# Patient Record
Sex: Female | Born: 1980 | Race: Black or African American | Hispanic: No | Marital: Single | Smoking: Former smoker
Health system: Southern US, Community
[De-identification: ages and names within clinical notes are randomized; demographics above are authoritative.]

## PROBLEM LIST (undated history)

## (undated) DIAGNOSIS — F329 Major depressive disorder, single episode, unspecified: Secondary | ICD-10-CM

## (undated) DIAGNOSIS — I10 Essential (primary) hypertension: Secondary | ICD-10-CM

## (undated) DIAGNOSIS — F419 Anxiety disorder, unspecified: Secondary | ICD-10-CM

## (undated) DIAGNOSIS — K5090 Crohn's disease, unspecified, without complications: Secondary | ICD-10-CM

## (undated) DIAGNOSIS — G510 Bell's palsy: Secondary | ICD-10-CM

## (undated) DIAGNOSIS — I639 Cerebral infarction, unspecified: Secondary | ICD-10-CM

## (undated) DIAGNOSIS — F32A Depression, unspecified: Secondary | ICD-10-CM

## (undated) HISTORY — PX: OTHER SURGICAL HISTORY: SHX169

## (undated) HISTORY — DX: Anxiety disorder, unspecified: F41.9

## (undated) HISTORY — DX: Depression, unspecified: F32.A

---
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
## 2000-03-04 ENCOUNTER — Emergency Department (HOSPITAL_COMMUNITY): Admission: EM | Admit: 2000-03-04 | Discharge: 2000-03-04 | Payer: Self-pay | Admitting: Emergency Medicine

## 2000-03-05 ENCOUNTER — Inpatient Hospital Stay (HOSPITAL_COMMUNITY): Admission: AD | Admit: 2000-03-05 | Discharge: 2000-03-05 | Payer: Self-pay | Admitting: Obstetrics & Gynecology

## 2000-11-19 ENCOUNTER — Emergency Department (HOSPITAL_COMMUNITY): Admission: EM | Admit: 2000-11-19 | Discharge: 2000-11-20 | Payer: Self-pay | Admitting: Emergency Medicine

## 2001-02-20 ENCOUNTER — Emergency Department (HOSPITAL_COMMUNITY): Admission: AC | Admit: 2001-02-20 | Discharge: 2001-02-20 | Payer: Self-pay

## 2001-02-20 ENCOUNTER — Encounter: Payer: Self-pay | Admitting: Emergency Medicine

## 2001-05-16 ENCOUNTER — Encounter: Payer: Self-pay | Admitting: Emergency Medicine

## 2001-05-16 ENCOUNTER — Emergency Department (HOSPITAL_COMMUNITY): Admission: EM | Admit: 2001-05-16 | Discharge: 2001-05-16 | Payer: Self-pay | Admitting: Emergency Medicine

## 2001-05-27 ENCOUNTER — Emergency Department (HOSPITAL_COMMUNITY): Admission: EM | Admit: 2001-05-27 | Discharge: 2001-05-27 | Payer: Self-pay | Admitting: Emergency Medicine

## 2001-06-22 ENCOUNTER — Encounter: Payer: Self-pay | Admitting: Emergency Medicine

## 2001-06-22 ENCOUNTER — Emergency Department (HOSPITAL_COMMUNITY): Admission: EM | Admit: 2001-06-22 | Discharge: 2001-06-22 | Payer: Self-pay | Admitting: Emergency Medicine

## 2001-07-18 ENCOUNTER — Other Ambulatory Visit: Admission: RE | Admit: 2001-07-18 | Discharge: 2001-07-18 | Payer: Self-pay | Admitting: Obstetrics and Gynecology

## 2001-10-02 ENCOUNTER — Emergency Department (HOSPITAL_COMMUNITY): Admission: EM | Admit: 2001-10-02 | Discharge: 2001-10-02 | Payer: Self-pay | Admitting: *Deleted

## 2001-10-02 ENCOUNTER — Encounter: Payer: Self-pay | Admitting: *Deleted

## 2002-01-10 ENCOUNTER — Encounter: Payer: Self-pay | Admitting: Emergency Medicine

## 2002-01-10 ENCOUNTER — Emergency Department (HOSPITAL_COMMUNITY): Admission: EM | Admit: 2002-01-10 | Discharge: 2002-01-10 | Payer: Self-pay | Admitting: Emergency Medicine

## 2002-05-17 ENCOUNTER — Encounter: Payer: Self-pay | Admitting: Emergency Medicine

## 2002-05-17 ENCOUNTER — Emergency Department (HOSPITAL_COMMUNITY): Admission: EM | Admit: 2002-05-17 | Discharge: 2002-05-18 | Payer: Self-pay | Admitting: Emergency Medicine

## 2002-05-18 ENCOUNTER — Ambulatory Visit (HOSPITAL_COMMUNITY): Admission: RE | Admit: 2002-05-18 | Discharge: 2002-05-18 | Payer: Self-pay | Admitting: *Deleted

## 2002-05-18 ENCOUNTER — Encounter: Payer: Self-pay | Admitting: Emergency Medicine

## 2002-05-18 ENCOUNTER — Emergency Department (HOSPITAL_COMMUNITY): Admission: EM | Admit: 2002-05-18 | Discharge: 2002-05-18 | Payer: Self-pay | Admitting: Emergency Medicine

## 2002-12-31 ENCOUNTER — Emergency Department (HOSPITAL_COMMUNITY): Admission: EM | Admit: 2002-12-31 | Discharge: 2002-12-31 | Payer: Self-pay | Admitting: Emergency Medicine

## 2003-01-09 ENCOUNTER — Encounter: Payer: Self-pay | Admitting: *Deleted

## 2003-01-09 ENCOUNTER — Inpatient Hospital Stay (HOSPITAL_COMMUNITY): Admission: AD | Admit: 2003-01-09 | Discharge: 2003-01-09 | Payer: Self-pay | Admitting: *Deleted

## 2003-01-26 ENCOUNTER — Inpatient Hospital Stay (HOSPITAL_COMMUNITY): Admission: AD | Admit: 2003-01-26 | Discharge: 2003-01-26 | Payer: Self-pay | Admitting: *Deleted

## 2003-01-26 ENCOUNTER — Encounter: Payer: Self-pay | Admitting: *Deleted

## 2003-04-15 ENCOUNTER — Encounter: Payer: Self-pay | Admitting: Obstetrics

## 2003-04-15 ENCOUNTER — Encounter: Payer: Self-pay | Admitting: Neurology

## 2003-04-15 ENCOUNTER — Inpatient Hospital Stay (HOSPITAL_COMMUNITY): Admission: EM | Admit: 2003-04-15 | Discharge: 2003-04-15 | Payer: Self-pay | Admitting: *Deleted

## 2003-05-18 ENCOUNTER — Inpatient Hospital Stay (HOSPITAL_COMMUNITY): Admission: AD | Admit: 2003-05-18 | Discharge: 2003-05-18 | Payer: Self-pay | Admitting: Obstetrics

## 2003-06-28 ENCOUNTER — Inpatient Hospital Stay (HOSPITAL_COMMUNITY): Admission: AD | Admit: 2003-06-28 | Discharge: 2003-06-28 | Payer: Self-pay | Admitting: Obstetrics

## 2003-06-28 ENCOUNTER — Encounter: Payer: Self-pay | Admitting: Obstetrics

## 2003-08-15 ENCOUNTER — Inpatient Hospital Stay (HOSPITAL_COMMUNITY): Admission: AD | Admit: 2003-08-15 | Discharge: 2003-08-16 | Payer: Self-pay | Admitting: Obstetrics

## 2003-08-16 ENCOUNTER — Inpatient Hospital Stay (HOSPITAL_COMMUNITY): Admission: AD | Admit: 2003-08-16 | Discharge: 2003-08-17 | Payer: Self-pay | Admitting: Obstetrics

## 2003-08-22 ENCOUNTER — Inpatient Hospital Stay (HOSPITAL_COMMUNITY): Admission: AD | Admit: 2003-08-22 | Discharge: 2003-08-25 | Payer: Self-pay | Admitting: Obstetrics

## 2004-08-22 ENCOUNTER — Emergency Department (HOSPITAL_COMMUNITY): Admission: EM | Admit: 2004-08-22 | Discharge: 2004-08-22 | Payer: Self-pay | Admitting: Emergency Medicine

## 2004-09-19 ENCOUNTER — Ambulatory Visit: Payer: Self-pay | Admitting: Gastroenterology

## 2004-09-22 ENCOUNTER — Ambulatory Visit: Payer: Self-pay | Admitting: Gastroenterology

## 2004-10-03 ENCOUNTER — Ambulatory Visit: Payer: Self-pay | Admitting: Gastroenterology

## 2004-10-27 ENCOUNTER — Ambulatory Visit: Payer: Self-pay | Admitting: Gastroenterology

## 2004-11-13 ENCOUNTER — Emergency Department (HOSPITAL_COMMUNITY): Admission: EM | Admit: 2004-11-13 | Discharge: 2004-11-13 | Payer: Self-pay | Admitting: Family Medicine

## 2005-06-24 ENCOUNTER — Emergency Department (HOSPITAL_COMMUNITY): Admission: EM | Admit: 2005-06-24 | Discharge: 2005-06-24 | Payer: Self-pay | Admitting: Family Medicine

## 2005-06-25 ENCOUNTER — Emergency Department (HOSPITAL_COMMUNITY): Admission: EM | Admit: 2005-06-25 | Discharge: 2005-06-25 | Payer: Self-pay | Admitting: Family Medicine

## 2005-08-01 ENCOUNTER — Inpatient Hospital Stay (HOSPITAL_COMMUNITY): Admission: AD | Admit: 2005-08-01 | Discharge: 2005-08-01 | Payer: Self-pay | Admitting: Obstetrics

## 2005-08-03 ENCOUNTER — Inpatient Hospital Stay (HOSPITAL_COMMUNITY): Admission: AD | Admit: 2005-08-03 | Discharge: 2005-08-03 | Payer: Self-pay | Admitting: Obstetrics

## 2005-11-12 ENCOUNTER — Inpatient Hospital Stay (HOSPITAL_COMMUNITY): Admission: AD | Admit: 2005-11-12 | Discharge: 2005-11-12 | Payer: Self-pay | Admitting: Obstetrics

## 2006-01-04 ENCOUNTER — Inpatient Hospital Stay (HOSPITAL_COMMUNITY): Admission: AD | Admit: 2006-01-04 | Discharge: 2006-01-04 | Payer: Self-pay | Admitting: Obstetrics

## 2006-03-29 ENCOUNTER — Inpatient Hospital Stay (HOSPITAL_COMMUNITY): Admission: AD | Admit: 2006-03-29 | Discharge: 2006-04-02 | Payer: Self-pay | Admitting: Obstetrics

## 2008-03-24 ENCOUNTER — Encounter: Payer: Self-pay | Admitting: Gastroenterology

## 2008-04-02 ENCOUNTER — Other Ambulatory Visit: Admission: RE | Admit: 2008-04-02 | Discharge: 2008-04-02 | Payer: Self-pay | Admitting: Family Medicine

## 2008-04-16 DIAGNOSIS — R29810 Facial weakness: Secondary | ICD-10-CM

## 2008-04-16 DIAGNOSIS — R519 Headache, unspecified: Secondary | ICD-10-CM | POA: Insufficient documentation

## 2008-04-16 DIAGNOSIS — R141 Gas pain: Secondary | ICD-10-CM

## 2008-04-16 DIAGNOSIS — R143 Flatulence: Secondary | ICD-10-CM

## 2008-04-16 DIAGNOSIS — R142 Eructation: Secondary | ICD-10-CM

## 2008-04-16 DIAGNOSIS — G510 Bell's palsy: Secondary | ICD-10-CM

## 2008-04-16 DIAGNOSIS — R51 Headache: Secondary | ICD-10-CM

## 2008-05-18 ENCOUNTER — Ambulatory Visit: Payer: Self-pay | Admitting: Gastroenterology

## 2008-05-18 DIAGNOSIS — R109 Unspecified abdominal pain: Secondary | ICD-10-CM

## 2008-05-18 DIAGNOSIS — K589 Irritable bowel syndrome without diarrhea: Secondary | ICD-10-CM | POA: Insufficient documentation

## 2008-05-18 DIAGNOSIS — R197 Diarrhea, unspecified: Secondary | ICD-10-CM

## 2008-05-18 DIAGNOSIS — I10 Essential (primary) hypertension: Secondary | ICD-10-CM | POA: Insufficient documentation

## 2008-05-18 DIAGNOSIS — R112 Nausea with vomiting, unspecified: Secondary | ICD-10-CM

## 2008-05-18 LAB — CONVERTED CEMR LAB
ALT: 13 units/L (ref 0–35)
Albumin: 4 g/dL (ref 3.5–5.2)
Basophils Relative: 0.4 % (ref 0.0–1.0)
Calcium: 9.3 mg/dL (ref 8.4–10.5)
Creatinine, Ser: 0.7 mg/dL (ref 0.4–1.2)
GFR calc non Af Amer: 108 mL/min
Hemoglobin: 13.2 g/dL (ref 12.0–15.0)
Lymphocytes Relative: 45.6 % (ref 12.0–46.0)
MCV: 88.5 fL (ref 78.0–100.0)
Monocytes Relative: 8.6 % (ref 3.0–12.0)
Platelets: 257 10*3/uL (ref 150–400)
Potassium: 3.8 meq/L (ref 3.5–5.1)
RBC: 4.4 M/uL (ref 3.87–5.11)
Saturation Ratios: 13.2 % — ABNORMAL LOW (ref 20.0–50.0)
Sed Rate: 22 mm/hr (ref 0–22)
TSH: 0.64 microintl units/mL (ref 0.35–5.50)
Total Bilirubin: 0.6 mg/dL (ref 0.3–1.2)
Total Protein: 7.5 g/dL (ref 6.0–8.3)
Vitamin B-12: 537 pg/mL (ref 211–911)

## 2008-06-21 ENCOUNTER — Ambulatory Visit: Payer: Self-pay | Admitting: Gastroenterology

## 2008-08-24 ENCOUNTER — Inpatient Hospital Stay (HOSPITAL_COMMUNITY): Admission: AD | Admit: 2008-08-24 | Discharge: 2008-08-24 | Payer: Self-pay | Admitting: Obstetrics & Gynecology

## 2008-12-20 ENCOUNTER — Inpatient Hospital Stay (HOSPITAL_COMMUNITY): Admission: AD | Admit: 2008-12-20 | Discharge: 2008-12-20 | Payer: Self-pay | Admitting: Obstetrics & Gynecology

## 2008-12-20 ENCOUNTER — Ambulatory Visit: Payer: Self-pay | Admitting: Physician Assistant

## 2009-03-21 ENCOUNTER — Inpatient Hospital Stay (HOSPITAL_COMMUNITY): Admission: AD | Admit: 2009-03-21 | Discharge: 2009-03-21 | Payer: Self-pay | Admitting: Family Medicine

## 2009-03-24 ENCOUNTER — Inpatient Hospital Stay (HOSPITAL_COMMUNITY): Admission: AD | Admit: 2009-03-24 | Discharge: 2009-03-24 | Payer: Self-pay | Admitting: Obstetrics & Gynecology

## 2009-03-26 ENCOUNTER — Inpatient Hospital Stay (HOSPITAL_COMMUNITY): Admission: AD | Admit: 2009-03-26 | Discharge: 2009-03-26 | Payer: Self-pay | Admitting: Obstetrics & Gynecology

## 2009-03-28 ENCOUNTER — Inpatient Hospital Stay (HOSPITAL_COMMUNITY): Admission: AD | Admit: 2009-03-28 | Discharge: 2009-03-28 | Payer: Self-pay | Admitting: Obstetrics & Gynecology

## 2009-03-31 ENCOUNTER — Inpatient Hospital Stay (HOSPITAL_COMMUNITY): Admission: AD | Admit: 2009-03-31 | Discharge: 2009-03-31 | Payer: Self-pay | Admitting: Obstetrics & Gynecology

## 2009-04-03 ENCOUNTER — Inpatient Hospital Stay (HOSPITAL_COMMUNITY): Admission: AD | Admit: 2009-04-03 | Discharge: 2009-04-03 | Payer: Self-pay | Admitting: Obstetrics and Gynecology

## 2009-04-06 ENCOUNTER — Inpatient Hospital Stay (HOSPITAL_COMMUNITY): Admission: AD | Admit: 2009-04-06 | Discharge: 2009-04-06 | Payer: Self-pay | Admitting: Obstetrics & Gynecology

## 2009-10-21 ENCOUNTER — Inpatient Hospital Stay (HOSPITAL_COMMUNITY): Admission: AD | Admit: 2009-10-21 | Discharge: 2009-10-21 | Payer: Self-pay | Admitting: Obstetrics & Gynecology

## 2009-12-29 ENCOUNTER — Ambulatory Visit (HOSPITAL_COMMUNITY): Admission: RE | Admit: 2009-12-29 | Discharge: 2009-12-29 | Payer: Self-pay | Admitting: Obstetrics

## 2010-01-11 ENCOUNTER — Inpatient Hospital Stay (HOSPITAL_COMMUNITY): Admission: AD | Admit: 2010-01-11 | Discharge: 2010-01-12 | Payer: Self-pay | Admitting: Obstetrics

## 2010-03-15 ENCOUNTER — Inpatient Hospital Stay (HOSPITAL_COMMUNITY): Admission: AD | Admit: 2010-03-15 | Discharge: 2010-03-16 | Payer: Self-pay | Admitting: Obstetrics

## 2010-03-20 ENCOUNTER — Inpatient Hospital Stay (HOSPITAL_COMMUNITY): Admission: AD | Admit: 2010-03-20 | Discharge: 2010-03-20 | Payer: Self-pay | Admitting: Obstetrics

## 2010-03-20 ENCOUNTER — Ambulatory Visit: Payer: Self-pay | Admitting: Obstetrics and Gynecology

## 2010-04-29 ENCOUNTER — Inpatient Hospital Stay (HOSPITAL_COMMUNITY): Admission: AD | Admit: 2010-04-29 | Discharge: 2010-05-03 | Payer: Self-pay | Admitting: Obstetrics

## 2010-07-27 ENCOUNTER — Emergency Department (HOSPITAL_COMMUNITY): Admission: EM | Admit: 2010-07-27 | Discharge: 2010-07-27 | Payer: Self-pay | Admitting: Emergency Medicine

## 2010-10-12 ENCOUNTER — Ambulatory Visit (HOSPITAL_COMMUNITY)
Admission: RE | Admit: 2010-10-12 | Discharge: 2010-10-12 | Payer: Self-pay | Source: Home / Self Care | Admitting: Chiropractic Medicine

## 2010-11-13 ENCOUNTER — Encounter: Admission: RE | Admit: 2010-11-13 | Payer: Self-pay | Source: Home / Self Care | Admitting: Orthopedic Surgery

## 2010-11-15 ENCOUNTER — Encounter
Admission: RE | Admit: 2010-11-15 | Discharge: 2010-12-11 | Payer: Self-pay | Source: Home / Self Care | Attending: Orthopedic Surgery | Admitting: Orthopedic Surgery

## 2011-01-03 ENCOUNTER — Encounter (HOSPITAL_COMMUNITY)
Admission: RE | Admit: 2011-01-03 | Discharge: 2011-01-03 | Disposition: A | Discharge status: Home / Self Care | Payer: No Typology Code available for payment source | Source: Ambulatory Visit | Attending: Orthopedic Surgery | Admitting: Orthopedic Surgery

## 2011-01-03 DIAGNOSIS — Z01812 Encounter for preprocedural laboratory examination: Secondary | ICD-10-CM | POA: Insufficient documentation

## 2011-01-03 DIAGNOSIS — Z01811 Encounter for preprocedural respiratory examination: Secondary | ICD-10-CM | POA: Insufficient documentation

## 2011-01-03 LAB — URINALYSIS, ROUTINE W REFLEX MICROSCOPIC
Bilirubin Urine: NEGATIVE
Ketones, ur: NEGATIVE mg/dL
Nitrite: NEGATIVE
Protein, ur: NEGATIVE mg/dL
Specific Gravity, Urine: 1.015 (ref 1.005–1.030)
pH: 7 (ref 5.0–8.0)

## 2011-01-03 LAB — BASIC METABOLIC PANEL
CO2: 26 mEq/L (ref 19–32)
Calcium: 9.4 mg/dL (ref 8.4–10.5)
Chloride: 109 mEq/L (ref 96–112)
Creatinine, Ser: 0.7 mg/dL (ref 0.4–1.2)
GFR calc Af Amer: >60 mL/min (ref >60)
GFR calc non Af Amer: >60 mL/min (ref >60)
Glucose, Bld: 94 mg/dL (ref 70–99)
Potassium: 4.3 mEq/L (ref 3.5–5.1)

## 2011-01-03 LAB — DIFFERENTIAL
Basophils Relative: 0 % (ref 0–1)
Eosinophils Absolute: 0.1 10*3/uL (ref 0.0–0.7)
Lymphocytes Relative: 38 % (ref 12–46)
Monocytes Relative: 7 % (ref 3–12)
Neutro Abs: 4 10*3/uL (ref 1.7–7.7)

## 2011-01-03 LAB — CBC
Platelets: 288 10*3/uL (ref 150–400)
RDW: 13.7 % (ref 11.5–15.5)

## 2011-01-03 LAB — PROTIME-INR: INR: 0.96 (ref 0.00–1.49)

## 2011-01-03 LAB — SURGICAL PCR SCREEN: Staphylococcus aureus: POSITIVE — AB

## 2011-01-05 ENCOUNTER — Ambulatory Visit (HOSPITAL_COMMUNITY)
Admission: RE | Admit: 2011-01-05 | Discharge: 2011-01-05 | Disposition: A | Discharge status: Home / Self Care | Payer: No Typology Code available for payment source | Source: Ambulatory Visit | Attending: Orthopedic Surgery | Admitting: Orthopedic Surgery

## 2011-01-05 DIAGNOSIS — Z01812 Encounter for preprocedural laboratory examination: Secondary | ICD-10-CM | POA: Insufficient documentation

## 2011-01-05 DIAGNOSIS — F17200 Nicotine dependence, unspecified, uncomplicated: Secondary | ICD-10-CM | POA: Insufficient documentation

## 2011-01-05 DIAGNOSIS — X58XXXA Exposure to other specified factors, initial encounter: Secondary | ICD-10-CM | POA: Insufficient documentation

## 2011-01-05 DIAGNOSIS — S43429A Sprain of unspecified rotator cuff capsule, initial encounter: Secondary | ICD-10-CM | POA: Insufficient documentation

## 2011-01-05 DIAGNOSIS — Y9269 Other specified industrial and construction area as the place of occurrence of the external cause: Secondary | ICD-10-CM | POA: Insufficient documentation

## 2011-01-05 DIAGNOSIS — Y990 Civilian activity done for income or pay: Secondary | ICD-10-CM | POA: Insufficient documentation

## 2011-01-09 NOTE — Op Note (Signed)
NAME:  Angela Joyce, Angela Joyce            ACCOUNT NO.:  [id number]  MEDICAL RECORD NO.:  [id number]           PATIENT TYPE:  O  LOCATION:  SDSC                         FACILITY:  [facility]  PHYSICIAN:  Almedia Balls. Ranell Patrick, M.D. DATE OF BIRTH:  01-Jun-1981  DATE OF PROCEDURE:  01/05/2011 DATE OF DISCHARGE:  01/05/2011                              OPERATIVE REPORT   PREOPERATIVE DIAGNOSIS:  Left shoulder rotator cuff tear.  POSTOPERATIVE DIAGNOSIS:  Left shoulder rotator cuff tear.  PROCEDURE PERFORMED:  Left shoulder arthroscopy with arthroscopic subacromial decompression followed by mini open rotator cuff repair.  SURGEON:  Almedia Balls. Ranell Patrick, MD  ASSISTANT:  Donnie Coffin. Dixon, PA  General anesthesia plus interscalene block anesthesia was used.  ESTIMATED BLOOD LOSS:  Minimal.  FLUID REPLACEMENT:  Crystalloid 1500 mL.  INSTRUMENT COUNTS:  Correct.  There were no complications.  Perioperative antibiotics were given.  INDICATIONS:  The patient is a 30 year old female who suffered a work injury to left shoulder.  The patient has had progressive pain, limited motion, and loss of function following her injury.  She has had failure of conservative management and desires operative treatment for an MRI- documented rotator cuff tear.  Informed consent obtained.  DESCRIPTION OF PROCEDURE:  After an adequate level of anesthesia was achieved, the patient was positioned in the modified beach-chair position.  Left shoulder examined under anesthesia.  Full passive range of motion noted with some slight instability, 1 to 2+ anterior glide with a good endpoint.  I am not able to dislocate her but cannot get her perched.  She has an inferior sulcus 1 to 2+ again with a good endpoint. She did have tightening with progressive abduction and external rotation.  She is stable posteriorly with negative posterior shuck, and with her arm flexed up in front of her I am not able to drive her humerus out the  back.  At this point, we went ahead and sterilely prepped and draped shoulder and arm in the usual manner.  We entered the shoulder using standard endoscopic portals in the anterior, posterolateral portals.  We identified normal superior labrum biceps junction.  She did have a rotator cuff tear which was partial-thickness undersurface tear.  At the supraspinatus, the subscapularis was visualized and noted to be normal.  The entire labrum was intact.  The patient did have some hypermobility in the shoulder joint and positive drive-through sign.  This did not appear significantly pathologic. Subacromially, we placed a scope in the subacromial space and there was extensive bursitis, performing a bursectomy and acromioplasty creating a type 1 acromial shape with release of the CA ligament.  Thorough decompression of the rotator cuff outlet with attention towards the lateral overhang was done.  We did not violate the inferior AC ligament. At this point, cuff was inspected from dorsally.  There was definitely a soft spot present at the anterior aspect of the supraspinatus just beyond the biceps.  At this point, we concluded the arthroscopic portion of the surgery.  We then made a mini open incision starting at the anterolateral border of the acromion extending distally about 3 cm, dissection down through subcutaneous tissues.  Using the needle tip Bovie, the deltoid was identified.  The raphe between the anterior and lateral heads was identified and we divided that sharply using the needle tip Bovie and knife.  We then placed our Arthrex retractor visualizing the rotator cuff tear.  It was clearly palpable and visible. We incised the tendon in line with its fibers using a 15-blade scalpel immediately encountering degenerative tissue.  We removed that sharply using knife and rongeur, freshened up the rotator cuff footprint with a rongeur and then placed a single 5.5 bio corkscrew anchor at  the articular margin bring this double loaded #2 FiberWire up through the anterior and posterior portions of the tendon, and we did a side-to-side medial to that with 0 Vicryl suture.  We then tied our two FiberWire sutures together, basically restoring the medial portion of footprint. We then did another FiberWire suture out lateral to the suture anchor sutures, tying that together and then another 0 Vicryl suture out even lateral to that with figure-of-eight.  We had an anatomic repair, not under tension, took the shoulder through full range of motion.  I did place the scope back in the shoulder to make sure the biceps had not been included in the repair which it did not.  We had a nice anatomic repair of that tendon all the way to the articular margin at this point. We thoroughly irrigated subdeltoid interval, closed the deltoid to itself with 0 Vicryl suture followed by 2-0 Vicryl subcutaneous closure and 4-0 Monocryl for skin.  Steri-Strips applied followed by sterile dressing.  The patient tolerated surgery well.     Almedia Balls. Ranell Patrick, M.D.     SRN/MEDQ  D:  01/05/2011  T:  01/06/2011  Job:  [id number]  Electronically Signed by Malon Kindle  on 01/09/2011 12:55:22 PM

## 2011-01-28 LAB — URINALYSIS, ROUTINE W REFLEX MICROSCOPIC
Glucose, UA: NEGATIVE mg/dL
Specific Gravity, Urine: 1.015 (ref 1.005–1.030)
Urobilinogen, UA: 0.2 mg/dL (ref 0.0–1.0)
pH: 6 (ref 5.0–8.0)

## 2011-01-28 LAB — COMPREHENSIVE METABOLIC PANEL
ALT: 13 U/L (ref 0–35)
BUN: 6 mg/dL (ref 6–23)
CO2: 21 mEq/L (ref 19–32)
CO2: 25 mEq/L (ref 19–32)
Calcium: 7.8 mg/dL — ABNORMAL LOW (ref 8.4–10.5)
Calcium: 9.4 mg/dL (ref 8.4–10.5)
Chloride: 106 mEq/L (ref 96–112)
Creatinine, Ser: 0.53 mg/dL (ref 0.4–1.2)
Creatinine, Ser: 0.92 mg/dL (ref 0.4–1.2)
GFR calc Af Amer: >60 mL/min (ref >60)
GFR calc non Af Amer: >60 mL/min (ref >60)
Glucose, Bld: 94 mg/dL (ref 70–99)
Sodium: 137 mEq/L (ref 135–145)
Total Bilirubin: 0.2 mg/dL — ABNORMAL LOW (ref 0.3–1.2)
Total Protein: 6.3 g/dL (ref 6.0–8.3)

## 2011-01-28 LAB — CBC
HCT: 31.9 % — ABNORMAL LOW (ref 36.0–46.0)
Hemoglobin: 10.1 g/dL — ABNORMAL LOW (ref 12.0–15.0)
Hemoglobin: 11 g/dL — ABNORMAL LOW (ref 12.0–15.0)
Hemoglobin: 11.6 g/dL — ABNORMAL LOW (ref 12.0–15.0)
MCHC: 34.6 g/dL (ref 30.0–36.0)
MCV: 94.2 fL (ref 78.0–100.0)
RBC: 3.1 MIL/uL — ABNORMAL LOW (ref 3.87–5.11)
RBC: 3.62 MIL/uL — ABNORMAL LOW (ref 3.87–5.11)
WBC: 8.9 10*3/uL (ref 4.0–10.5)

## 2011-01-28 LAB — STREP B DNA PROBE: Strep Group B Ag: NEGATIVE

## 2011-01-28 LAB — LACTATE DEHYDROGENASE: LDH: 176 U/L (ref 94–250)

## 2011-01-30 LAB — WET PREP, GENITAL
Clue Cells Wet Prep HPF POC: NONE SEEN
Trich, Wet Prep: NONE SEEN
Yeast Wet Prep HPF POC: NONE SEEN

## 2011-01-30 LAB — URINALYSIS, ROUTINE W REFLEX MICROSCOPIC
Bilirubin Urine: NEGATIVE
Glucose, UA: NEGATIVE mg/dL
Hgb urine dipstick: NEGATIVE
Ketones, ur: NEGATIVE mg/dL
Ketones, ur: NEGATIVE mg/dL
Nitrite: NEGATIVE
Protein, ur: 30 mg/dL — AB
Protein, ur: NEGATIVE mg/dL
Urobilinogen, UA: 0.2 mg/dL (ref 0.0–1.0)
Urobilinogen, UA: 0.2 mg/dL (ref 0.0–1.0)
pH: 6 (ref 5.0–8.0)

## 2011-01-30 LAB — URINE MICROSCOPIC-ADD ON

## 2011-02-02 LAB — CBC
Hemoglobin: 10.6 g/dL — ABNORMAL LOW (ref 12.0–15.0)
MCHC: 34 g/dL (ref 30.0–36.0)
MCV: 90.8 fL (ref 78.0–100.0)
RBC: 3.42 MIL/uL — ABNORMAL LOW (ref 3.87–5.11)
WBC: 9.1 10*3/uL (ref 4.0–10.5)

## 2011-02-02 LAB — COMPREHENSIVE METABOLIC PANEL
ALT: 24 U/L (ref 0–35)
AST: 24 U/L (ref 0–37)
CO2: 20 mEq/L (ref 19–32)
Chloride: 104 mEq/L (ref 96–112)
Creatinine, Ser: 0.5 mg/dL (ref 0.4–1.2)
GFR calc Af Amer: >60 mL/min (ref >60)
GFR calc non Af Amer: >60 mL/min (ref >60)
Glucose, Bld: 100 mg/dL — ABNORMAL HIGH (ref 70–99)
Sodium: 133 mEq/L — ABNORMAL LOW (ref 135–145)
Total Bilirubin: 0.1 mg/dL — ABNORMAL LOW (ref 0.3–1.2)

## 2011-02-02 LAB — URINALYSIS, ROUTINE W REFLEX MICROSCOPIC
Bilirubin Urine: NEGATIVE
Ketones, ur: NEGATIVE mg/dL
Nitrite: NEGATIVE
Protein, ur: NEGATIVE mg/dL
Specific Gravity, Urine: 1.02 (ref 1.005–1.030)
Urobilinogen, UA: 0.2 mg/dL (ref 0.0–1.0)

## 2011-02-02 LAB — URINE MICROSCOPIC-ADD ON

## 2011-02-13 LAB — WET PREP, GENITAL

## 2011-02-13 LAB — CBC
Hemoglobin: 12.1 g/dL (ref 12.0–15.0)
RBC: 4.06 MIL/uL (ref 3.87–5.11)
WBC: 6.1 10*3/uL (ref 4.0–10.5)

## 2011-02-13 LAB — URINALYSIS, ROUTINE W REFLEX MICROSCOPIC
Bilirubin Urine: NEGATIVE
Ketones, ur: NEGATIVE mg/dL
Nitrite: NEGATIVE
Urobilinogen, UA: 0.2 mg/dL (ref 0.0–1.0)

## 2011-02-20 LAB — DIFFERENTIAL
Basophils Relative: 0 % (ref 0–1)
Eosinophils Absolute: 0.1 10*3/uL (ref 0.0–0.7)
Eosinophils Absolute: 0.1 10*3/uL (ref 0.0–0.7)
Eosinophils Relative: 2 % (ref 0–5)
Eosinophils Relative: 2 % (ref 0–5)
Lymphocytes Relative: 47 % — ABNORMAL HIGH (ref 12–46)
Lymphs Abs: 1.8 10*3/uL (ref 0.7–4.0)
Lymphs Abs: 2 10*3/uL (ref 0.7–4.0)
Lymphs Abs: 2.3 10*3/uL (ref 0.7–4.0)
Monocytes Absolute: 0.4 10*3/uL (ref 0.1–1.0)
Monocytes Relative: 11 % (ref 3–12)
Monocytes Relative: 8 % (ref 3–12)
Neutro Abs: 2.1 10*3/uL (ref 1.7–7.7)
Neutro Abs: 2.7 10*3/uL (ref 1.7–7.7)
Neutrophils Relative %: 50 % (ref 43–77)

## 2011-02-20 LAB — URINALYSIS, ROUTINE W REFLEX MICROSCOPIC
Bilirubin Urine: NEGATIVE
Ketones, ur: NEGATIVE mg/dL
Nitrite: NEGATIVE
Protein, ur: NEGATIVE mg/dL
pH: 6 (ref 5.0–8.0)

## 2011-02-20 LAB — WET PREP, GENITAL: Yeast Wet Prep HPF POC: NONE SEEN

## 2011-02-20 LAB — COMPREHENSIVE METABOLIC PANEL
ALT: 12 U/L (ref 0–35)
AST: 16 U/L (ref 0–37)
CO2: 22 mEq/L (ref 19–32)
Calcium: 9.2 mg/dL (ref 8.4–10.5)
Chloride: 108 mEq/L (ref 96–112)
GFR calc Af Amer: >60 mL/min (ref >60)
GFR calc non Af Amer: >60 mL/min (ref >60)
Potassium: 3.7 mEq/L (ref 3.5–5.1)
Sodium: 139 mEq/L (ref 135–145)

## 2011-02-20 LAB — CBC
HCT: 36.7 % (ref 36.0–46.0)
Hemoglobin: 12.5 g/dL (ref 12.0–15.0)
MCHC: 34.2 g/dL (ref 30.0–36.0)
MCV: 88 fL (ref 78.0–100.0)
Platelets: 274 10*3/uL (ref 150–400)
RBC: 4.24 MIL/uL (ref 3.87–5.11)
RBC: 4.16 MIL/uL (ref 3.87–5.11)
RDW: 13.9 % (ref 11.5–15.5)
WBC: 4.5 10*3/uL (ref 4.0–10.5)
WBC: 4.8 10*3/uL (ref 4.0–10.5)
WBC: 5.4 10*3/uL (ref 4.0–10.5)

## 2011-02-20 LAB — GC/CHLAMYDIA PROBE AMP, GENITAL: Chlamydia, DNA Probe: NEGATIVE

## 2011-02-20 LAB — CREATININE, SERUM: GFR calc non Af Amer: >60 mL/min (ref >60)

## 2011-02-20 LAB — HCG, QUANTITATIVE, PREGNANCY
hCG, Beta Chain, Quant, S: 14 m[IU]/mL — ABNORMAL HIGH (ref <5)
hCG, Beta Chain, Quant, S: 158 m[IU]/mL — ABNORMAL HIGH (ref <5)
hCG, Beta Chain, Quant, S: 165 m[IU]/mL — ABNORMAL HIGH (ref <5)
hCG, Beta Chain, Quant, S: 171 m[IU]/mL — ABNORMAL HIGH (ref <5)

## 2011-02-27 LAB — URINALYSIS, ROUTINE W REFLEX MICROSCOPIC
Glucose, UA: NEGATIVE mg/dL
Nitrite: NEGATIVE
Protein, ur: NEGATIVE mg/dL
pH: 5.5 (ref 5.0–8.0)

## 2011-02-27 LAB — POCT PREGNANCY, URINE: Preg Test, Ur: NEGATIVE

## 2011-02-27 LAB — WET PREP, GENITAL: Yeast Wet Prep HPF POC: NONE SEEN

## 2011-03-30 NOTE — Consult Note (Signed)
NAME:  Angela Joyce, Angela Joyce                      ACCOUNT NO.:  [id number]   MEDICAL RECORD NO.:  [id number]                   PATIENT TYPE:  EMS   LOCATION:  MAJO                                 FACILITY:  [facility]   PHYSICIAN:  Gustavus Messing. Orlin Hilding, M.D.          DATE OF BIRTH:  Dec 26, 1980   DATE OF CONSULTATION:  04/15/2003  DATE OF DISCHARGE:                                   CONSULTATION   CHIEF COMPLAINT:  Right-sided facial weakness.   HISTORY OF PRESENT ILLNESS:  Ms. Lonardo is a 30 year old black woman (P2,  G0, A1) [redacted] weeks pregnant presented to [facility] this morning with a  five-day history of right-sided facial weakness, pain, drooling, and  tearing.  Slightly better today but because the symptoms were persistent she  came to the emergency room.  She was supposed to come in yesterday but felt  that she had to go to work, so presented today instead.  She has had no  symptoms of right arm or leg weakness.   REVIEW OF SYSTEMS:  Positive for mild headache on the right.  No  complications with this pregnancy.  She has bitten her cheek because of the  facial weakness.   PAST MEDICAL HISTORY:  Significant for herpes, one abortion at about 8 weeks  of pregnancy.  She is otherwise healthy.   MEDICATIONS:  Prenatal vitamins.   ALLERGIES:  No known drug allergies.   SOCIAL HISTORY:  Noncontributory.   FAMILY HISTORY:  Noncontributory.   PHYSICAL EXAMINATION:  VITAL SIGNS:  Temperature is 98.5, pulse 85, blood  pressure 126/63, respirations 16 with 100% saturation.  HEENT:  Head is normocephalic, atraumatic.  NECK:  Supple.  NEUROLOGIC:  Mental status:  She is awake, alert, and appropriate.  Normal  fluent spontaneous language.  I did not do a formal Mini Mental Status exam.  Cranial nerves:  Pupils were equal and reactive.  Visual fields were full to  confrontation.  Disk margins are sharp.  Extraocular movements are intact.  Facial sensation is normal.  Facial motor  activity reveals a very mild  peripheral seventh with slightly decreased forehead corrugations, slightly  diminished eyelid closure, slight lower facial weakness.  Hearing is intact.  Palate symmetric and tongue is midline.  On motor exam, she has normal bulk,  tone, and strength throughout and 5/5 strength in all four extremities.  No  drift or satelliting.  Normal rapid fine movement.  No fasciculations  __________  tremor.  Reflexes are 2+ and symmetric in the upper extremities  with downgoing toes and normal reflexes in the lower extremities as well.  Coordination:  Finger-to-nose, rapid alternating movements, and heel-to-shin  are normal.  Sensory exam is intact to light touch and temperature.   LABORATORY DATA:  MRI scan of the brain was performed.  There is some  artifact from dental work but no acute abnormalities were seen on the  diffusion weighted images and the remainder  of the brain is normal.  Pituitary is large with a convex superior surface but no hemorrhage.  This  would be appropriate for pregnancy.  Intracranial MR angiogram was normal  and intracranial venogram was also normal.   ASSESSMENT:  Probable mild right Bell's palsy with negative MRI.   RECOMMENDATIONS:  Since the symptoms are very mild and have been present for  five days and she is [redacted] weeks pregnant, will not treat with steroids or  antiviral agent.  She is able to achieve complete eye closures.  I do not  believe that she needs any kind of specific eye care.  I reassured her this  should continue to improve gradually with time.  She is to return to [facility] for further obstetrics evaluation.                                               Catherine A. Orlin Hilding, M.D.    CAW/MEDQ  D:  04/15/2003  T:  04/15/2003  Job:  [id number]   cc:   Kathreen Cosier, M.D.  [address]  [place]  [place] [address]  Fax: [phone]

## 2011-06-05 ENCOUNTER — Inpatient Hospital Stay (HOSPITAL_COMMUNITY)
Admission: AD | Admit: 2011-06-05 | Discharge: 2011-06-05 | Disposition: A | Discharge status: Other Acute Inpatient Hospital | Payer: Self-pay | Source: Ambulatory Visit | Attending: Obstetrics & Gynecology | Admitting: Obstetrics & Gynecology

## 2011-06-05 ENCOUNTER — Emergency Department (HOSPITAL_COMMUNITY): Payer: Self-pay

## 2011-06-05 ENCOUNTER — Emergency Department (HOSPITAL_COMMUNITY)
Admission: EM | Admit: 2011-06-05 | Discharge: 2011-06-05 | Disposition: A | Discharge status: Home / Self Care | Payer: Self-pay | Attending: Emergency Medicine | Admitting: Emergency Medicine

## 2011-06-05 ENCOUNTER — Encounter (HOSPITAL_COMMUNITY): Payer: Self-pay | Admitting: *Deleted

## 2011-06-05 DIAGNOSIS — R109 Unspecified abdominal pain: Secondary | ICD-10-CM | POA: Insufficient documentation

## 2011-06-05 DIAGNOSIS — R112 Nausea with vomiting, unspecified: Secondary | ICD-10-CM | POA: Insufficient documentation

## 2011-06-05 DIAGNOSIS — R141 Gas pain: Secondary | ICD-10-CM | POA: Insufficient documentation

## 2011-06-05 DIAGNOSIS — I10 Essential (primary) hypertension: Secondary | ICD-10-CM | POA: Insufficient documentation

## 2011-06-05 DIAGNOSIS — F17200 Nicotine dependence, unspecified, uncomplicated: Secondary | ICD-10-CM | POA: Insufficient documentation

## 2011-06-05 DIAGNOSIS — R197 Diarrhea, unspecified: Secondary | ICD-10-CM | POA: Insufficient documentation

## 2011-06-05 DIAGNOSIS — R142 Eructation: Secondary | ICD-10-CM | POA: Insufficient documentation

## 2011-06-05 DIAGNOSIS — R143 Flatulence: Secondary | ICD-10-CM | POA: Insufficient documentation

## 2011-06-05 DIAGNOSIS — K5090 Crohn's disease, unspecified, without complications: Secondary | ICD-10-CM | POA: Insufficient documentation

## 2011-06-05 HISTORY — DX: Bell's palsy: G51.0

## 2011-06-05 HISTORY — DX: Crohn's disease, unspecified, without complications: K50.90

## 2011-06-05 HISTORY — DX: Cerebral infarction, unspecified: I63.9

## 2011-06-05 HISTORY — DX: Essential (primary) hypertension: I10

## 2011-06-05 LAB — COMPREHENSIVE METABOLIC PANEL
ALT: 8 U/L (ref 0–35)
BUN: 10 mg/dL (ref 6–23)
BUN: 9 mg/dL (ref 6–23)
CO2: 24 mEq/L (ref 19–32)
Calcium: 9.1 mg/dL (ref 8.4–10.5)
Calcium: 8.7 mg/dL (ref 8.4–10.5)
Creatinine, Ser: 0.52 mg/dL (ref 0.50–1.10)
GFR calc Af Amer: >60 mL/min (ref >60)
GFR calc Af Amer: >60 mL/min (ref >60)
GFR calc non Af Amer: >60 mL/min (ref >60)
Glucose, Bld: 96 mg/dL (ref 70–99)
Glucose, Bld: 97 mg/dL (ref 70–99)
Sodium: 135 mEq/L (ref 135–145)
Total Protein: 8.2 g/dL (ref 6.0–8.3)
Total Protein: 7.2 g/dL (ref 6.0–8.3)

## 2011-06-05 LAB — CBC
HCT: 39.5 % (ref 36.0–46.0)
HCT: 37.2 % (ref 36.0–46.0)
Hemoglobin: 12.4 g/dL (ref 12.0–15.0)
MCH: 29 pg (ref 26.0–34.0)
MCH: 27.7 pg (ref 26.0–34.0)
MCHC: 33.3 g/dL (ref 30.0–36.0)
MCV: 83.5 fL (ref 78.0–100.0)
MCV: 83 fL (ref 78.0–100.0)
Platelets: 257 10*3/uL (ref 150–400)
RBC: 4.73 MIL/uL (ref 3.87–5.11)
WBC: 5.2 10*3/uL (ref 4.0–10.5)

## 2011-06-05 LAB — URINALYSIS, ROUTINE W REFLEX MICROSCOPIC
Bilirubin Urine: NEGATIVE
Bilirubin Urine: NEGATIVE
Leukocytes, UA: NEGATIVE
Nitrite: NEGATIVE
Nitrite: NEGATIVE
Protein, ur: NEGATIVE mg/dL
Specific Gravity, Urine: 1.015 (ref 1.005–1.030)
Specific Gravity, Urine: 1.01 (ref 1.005–1.030)
Urobilinogen, UA: 0.2 mg/dL (ref 0.0–1.0)
Urobilinogen, UA: 0.2 mg/dL (ref 0.0–1.0)
pH: 6.5 (ref 5.0–8.0)

## 2011-06-05 LAB — DIFFERENTIAL
Eosinophils Absolute: 0.1 10*3/uL (ref 0.0–0.7)
Eosinophils Relative: 1 % (ref 0–5)
Lymphocytes Relative: 47 % — ABNORMAL HIGH (ref 12–46)
Lymphocytes Relative: 53 % — ABNORMAL HIGH (ref 12–46)
Lymphs Abs: 2.4 10*3/uL (ref 0.7–4.0)
Monocytes Absolute: 0.4 10*3/uL (ref 0.1–1.0)
Monocytes Absolute: 0.3 10*3/uL (ref 0.1–1.0)
Monocytes Relative: 7 % (ref 3–12)
Neutro Abs: 1.7 10*3/uL (ref 1.7–7.7)

## 2011-06-05 LAB — LIPASE, BLOOD: Lipase: 16 U/L (ref 11–59)

## 2011-06-05 MED ORDER — HYDROMORPHONE HCL 1 MG/ML IJ SOLN
INTRAMUSCULAR | Status: AC
  Filled 2011-06-05: qty 1

## 2011-06-05 MED ORDER — HYDROMORPHONE HCL 1 MG/ML IJ SOLN
INTRAMUSCULAR | Status: AC
  Administered 2011-06-05: 07:00:00 via INTRAMUSCULAR
  Filled 2011-06-05: qty 1

## 2011-06-05 MED ORDER — SODIUM CHLORIDE 0.9 % IV SOLN
Freq: Once | INTRAVENOUS | Status: AC
  Administered 2011-06-05: 07:00:00 via INTRAVENOUS

## 2011-06-05 MED ORDER — HYDROMORPHONE HCL 1 MG/ML IJ SOLN: 1.0000 mg | Freq: Once | INTRAMUSCULAR | Status: DC

## 2011-06-05 NOTE — Progress Notes (Signed)
Pt G4 P3, LMP 05/31/2011, having abd pain, N/V/D, HX chrones disease.

## 2011-06-05 NOTE — ED Notes (Signed)
One dilaudid mg given as witnessed by J Spurlock-Frizzell RN, the other was returned.  This medicine was given IVP

## 2011-06-05 NOTE — ED Provider Notes (Signed)
History     Chief Complaint  Patient presents with  . Abdominal Pain  . Emesis   HPI  Angela Joyce  is a 30 y.o. [id number], not pregnant, presenting with abdominal pain. H/O Chrones disease. States this feels like an exacerbation. Hurting since Thursday, + n/v, diarrhea, bloating. Also has uncontrolled CHTN. Has not been seen by a physician for her medical conditions in 4 years.     Past Medical History  Diagnosis Date  . Crohn's disease   . Stroke   . Migraine   . Bell's palsy     Past Surgical History  Procedure Date  . Rotary cuff repair     No family history on file.  History  Substance Use Topics  . Smoking status: Current Everyday Smoker -- 1.0 packs/day for 14 years    Types: Cigarettes  . Smokeless tobacco: Not on file  . Alcohol Use: No    Allergies: Allergies not on file  No prescriptions prior to admission    Review of Systems  Respiratory: Negative.   Cardiovascular: Negative.   Gastrointestinal: Positive for nausea, vomiting, abdominal pain and diarrhea.  Genitourinary: Negative.   Musculoskeletal: Negative.   Neurological: Negative.   Psychiatric/Behavioral: Negative.    Physical Exam   Blood pressure 152/113, pulse 77, temperature 98.1 F (36.7 C), temperature source Oral, resp. rate 20, height 5\' 5"  (1.651 m), weight 152 lb 12.8 oz (69.31 kg), SpO2 98.00%.  Physical Exam  Constitutional: She is oriented to person, place, and time. She appears well-developed and well-nourished. She appears distressed.  GI: Soft. She exhibits no mass. Bowel sounds are increased. There is tenderness. There is no rebound and no guarding.  Musculoskeletal: Normal range of motion.  Neurological: She is alert and oriented to person, place, and time.  Skin: Skin is warm and dry.  Psychiatric: She has a normal mood and affect.    MAU Course  Procedures     Assessment and Plan  30 yo female with abdominal pain CBC, CMP, UA ordered  IV hydration  initiated, Dilaudid 1 mg IV for pain Transfer to [facility] via [facility]. Clarene Duke accepts transfer  [facility] 06/05/2011, 6:42 AM

## 2011-06-05 NOTE — Progress Notes (Signed)
Pt c/o abd pain like she had 8 yrs ago,  Hx of diarrhea - no diarrhea today, Nausea - vomited today x 1, abd pain "is killing me".  Hurting since Thursday

## 2011-06-06 LAB — URINE CULTURE

## 2011-08-13 LAB — DIFFERENTIAL
Basophils Absolute: 0
Basophils Relative: 1
Eosinophils Absolute: 0.1
Eosinophils Relative: 1
Monocytes Absolute: 0.4
Monocytes Relative: 8
Neutro Abs: 2.1

## 2011-08-13 LAB — URINALYSIS, ROUTINE W REFLEX MICROSCOPIC
Bilirubin Urine: NEGATIVE
Nitrite: NEGATIVE
Specific Gravity, Urine: 1.02
pH: 6

## 2011-08-13 LAB — POCT PREGNANCY, URINE: Preg Test, Ur: NEGATIVE

## 2011-08-13 LAB — CBC
HCT: 40.1
Hemoglobin: 13.2
MCHC: 32.9
MCV: 88
RBC: 4.56
RDW: 14.2

## 2011-08-13 LAB — WET PREP, GENITAL
Trich, Wet Prep: NONE SEEN
Yeast Wet Prep HPF POC: NONE SEEN

## 2011-08-13 LAB — GC/CHLAMYDIA PROBE AMP, GENITAL: GC Probe Amp, Genital: NEGATIVE

## 2014-09-13 ENCOUNTER — Encounter (HOSPITAL_COMMUNITY): Payer: Self-pay | Admitting: *Deleted

## 2015-12-24 ENCOUNTER — Encounter (HOSPITAL_COMMUNITY): Payer: Self-pay | Admitting: Emergency Medicine

## 2015-12-24 ENCOUNTER — Emergency Department (HOSPITAL_COMMUNITY)
Admission: EM | Admit: 2015-12-24 | Discharge: 2015-12-24 | Disposition: A | Discharge status: Home / Self Care | Payer: Medicaid Other | Attending: Emergency Medicine | Admitting: Emergency Medicine

## 2015-12-24 DIAGNOSIS — Z79899 Other long term (current) drug therapy: Secondary | ICD-10-CM | POA: Diagnosis not present

## 2015-12-24 DIAGNOSIS — F17210 Nicotine dependence, cigarettes, uncomplicated: Secondary | ICD-10-CM | POA: Diagnosis not present

## 2015-12-24 DIAGNOSIS — L03116 Cellulitis of left lower limb: Secondary | ICD-10-CM | POA: Insufficient documentation

## 2015-12-24 DIAGNOSIS — Z8719 Personal history of other diseases of the digestive system: Secondary | ICD-10-CM | POA: Insufficient documentation

## 2015-12-24 DIAGNOSIS — L03115 Cellulitis of right lower limb: Secondary | ICD-10-CM | POA: Insufficient documentation

## 2015-12-24 DIAGNOSIS — Z8673 Personal history of transient ischemic attack (TIA), and cerebral infarction without residual deficits: Secondary | ICD-10-CM | POA: Diagnosis not present

## 2015-12-24 DIAGNOSIS — L03119 Cellulitis of unspecified part of limb: Secondary | ICD-10-CM

## 2015-12-24 DIAGNOSIS — G43909 Migraine, unspecified, not intractable, without status migrainosus: Secondary | ICD-10-CM | POA: Diagnosis not present

## 2015-12-24 DIAGNOSIS — I10 Essential (primary) hypertension: Secondary | ICD-10-CM | POA: Insufficient documentation

## 2015-12-24 DIAGNOSIS — R21 Rash and other nonspecific skin eruption: Secondary | ICD-10-CM | POA: Diagnosis present

## 2015-12-24 MED ORDER — CEPHALEXIN 500 MG PO CAPS: 1000.0000 mg | ORAL_CAPSULE | Freq: Two times a day (BID) | ORAL | Status: DC

## 2015-12-24 MED ORDER — CEPHALEXIN 500 MG PO CAPS
1000.0000 mg | ORAL_CAPSULE | Freq: Once | ORAL | Status: AC
  Administered 2015-12-24: 1000 mg via ORAL
  Filled 2015-12-24: qty 2

## 2015-12-24 MED ORDER — TRAMADOL HCL 50 MG PO TABS
50.0000 mg | ORAL_TABLET | Freq: Once | ORAL | Status: AC
  Administered 2015-12-24: 50 mg via ORAL
  Filled 2015-12-24: qty 1

## 2015-12-24 MED ORDER — TRAMADOL HCL 50 MG PO TABS: 50.0000 mg | ORAL_TABLET | Freq: Four times a day (QID) | ORAL | Status: DC | PRN

## 2015-12-24 NOTE — Discharge Instructions (Signed)
Please take antibiotic as prescribed for the full duration.  Return in 48 hrs if you notice no improvement of your symptoms.   Cellulitis Cellulitis is an infection of the skin and the tissue beneath it. The infected area is usually red and tender. Cellulitis occurs most often in the arms and lower legs.  CAUSES  Cellulitis is caused by bacteria that enter the skin through cracks or cuts in the skin. The most common types of bacteria that cause cellulitis are staphylococci and streptococci. SIGNS AND SYMPTOMS   Redness and warmth.  Swelling.  Tenderness or pain.  Fever. DIAGNOSIS  Your health care provider can usually determine what is wrong based on a physical exam. Blood tests may also be done. TREATMENT  Treatment usually involves taking an antibiotic medicine. HOME CARE INSTRUCTIONS   Take your antibiotic medicine as directed by your health care provider. Finish the antibiotic even if you start to feel better.  Keep the infected arm or leg elevated to reduce swelling.  Apply a warm cloth to the affected area up to 4 times per day to relieve pain.  Take medicines only as directed by your health care provider.  Keep all follow-up visits as directed by your health care provider. SEEK MEDICAL CARE IF:   You notice red streaks coming from the infected area.  Your red area gets larger or turns dark in color.  Your bone or joint underneath the infected area becomes painful after the skin has healed.  Your infection returns in the same area or another area.  You notice a swollen bump in the infected area.  You develop new symptoms.  You have a fever. SEEK IMMEDIATE MEDICAL CARE IF:   You feel very sleepy.  You develop vomiting or diarrhea.  You have a general ill feeling (malaise) with muscle aches and pains.   This information is not intended to replace advice given to you by your health care provider. Make sure you discuss any questions you have with your health  care provider.   Document Released: 08/08/2005 Document Revised: 07/20/2015 Document Reviewed: 01/14/2012 Elsevier Interactive Patient Education [employer]

## 2015-12-24 NOTE — ED Notes (Signed)
Patient presents for possible rash/spider bite x2 days. Patient has reddened areas to left upper ankle and right calf with burning, fever at home 101 (600mg  ibuprofen). C/o generalized aching and tingling.

## 2015-12-24 NOTE — ED Provider Notes (Signed)
CSN: [id number]     Arrival date & time 12/24/15  1937 History   First MD Initiated Contact with Patient 12/24/15 2129     Chief Complaint  Patient presents with  . Rash  . Insect Bite     (Consider location/radiation/quality/duration/timing/severity/associated sxs/prior Treatment) HPI   35 year old female with history of Crohn's disease, Bell's palsy, hypertension presents with a complaint of possible insect bites. Patient reports for the past 2 days she has noticed several rash noted on her left lower shin, right forearm and right leg. She described pain as itchy and painful, persistent, with burning sensation. She is unsure if she has been bitten by any bugs but states that she has been staying over her neighbor's house for the past several days and he has had symptoms of rash several weeks ago that may appear similar. She has been taking ibuprofen at home along with Benadryl without adequate relief. She denies having fever, throat swelling, chest pain, shortness of breath, abdominal cramping, nausea vomiting diarrhea, or severe headache. She denies any change in soap, detergent, or body wash. No new medication changes or new pets.  Past Medical History  Diagnosis Date  . Crohn's disease (HCC)   . Stroke (HCC)   . Migraine   . Bell's palsy   . Hypertension    Past Surgical History  Procedure Laterality Date  . Rotary cuff repair     No family history on file. Social History  Substance Use Topics  . Smoking status: Current Every Day Smoker -- 1.00 packs/day for 14 years    Types: Cigarettes  . Smokeless tobacco: None  . Alcohol Use: No   OB History    Gravida Para Term Preterm AB TAB SAB Ectopic Multiple Living   4 3   1 1    3      Review of Systems  All other systems reviewed and are negative.     Allergies  Review of patient's allergies indicates no known allergies.  Home Medications   Prior to Admission medications   Medication Sig Start Date End Date Taking?  Authorizing Provider  diphenhydrAMINE (BENADRYL) 25 MG tablet Take 25 mg by mouth every 6 (six) hours as needed for itching or allergies.   Yes Historical Provider, MD  ibuprofen (ADVIL,MOTRIN) 200 MG tablet Take 600 mg by mouth every 6 (six) hours as needed for moderate pain.   Yes Historical Provider, MD  lisinopril (PRINIVIL,ZESTRIL) 5 MG tablet Take 5 mg by mouth daily.  10/10/15  Yes Historical Provider, MD  metoprolol tartrate (LOPRESSOR) 25 MG tablet Take 25 mg by mouth daily.  10/10/15  Yes Historical Provider, MD  Multiple Vitamins-Minerals (MULTIVITAMIN & MINERAL PO) Take 1 tablet by mouth daily.   Yes Historical Provider, MD  Omega-3 Fatty Acids (FISH OIL) 1000 MG CAPS Take 1 capsule by mouth daily.   Yes Historical Provider, MD   BP 167/123 mmHg  Pulse 111  Temp(Src) 98.5 F (36.9 C) (Oral)  Resp 18  SpO2 100%  LMP 12/13/2015 (Approximate) Physical Exam  Constitutional: She appears well-developed and well-nourished. No distress.  African-American female appears to be in no acute discomfort.  HENT:  Head: Atraumatic.  Eyes: Conjunctivae are normal.  Neck: Neck supple.  Cardiovascular: Intact distal pulses.   Musculoskeletal: She exhibits no edema.  Neurological: She is alert.  Skin: Rash (Patient with multiple confluence erythematous patch noted to her left mid tib-fib region, right posterior calf, right forearm most prominent rashes in her left lower leg  that are tender to palpation but no fluctuance.) noted.  Psychiatric: She has a normal mood and affect.  Nursing note and vitals reviewed.   ED Course  Procedures (including critical care time) Labs Review Labs Reviewed - No data to display  Imaging Review No results found. I have personally reviewed and evaluated these images and lab results as part of my medical decision-making.   EKG Interpretation None      MDM   Final diagnoses:  Cellulitis of lower extremity, unspecified laterality    BP 162/108  mmHg  Pulse 87  Temp(Src) 98.5 F (36.9 C) (Oral)  Resp 17  SpO2 99%  LMP 12/13/2015 (Approximate) The patient was noted to be hypertensive today in the emergency department. I have spoken with the patient regarding hypertension and the need for improved management. I instructed the patient to followup with the Primary care doctor within 4 days to improve the management of the patient's hypertension. I also counseled the patient regarding the signs and symptoms which would require an emergent visit to an emergency department for hypertensive urgency and/or hypertensive emergency. The patient understood the need for improved hypertensive management.   10:00 PM Patient presents with what appears to be localized skin irritation from possible insect bites. These are early forming cellulitis in which to will need to be treated with antibiotic and pain medication provided. Recommend taking Benadryl for itchiness. She does not have any signs of anaphylactic reaction. She will need to return in 48 hours for wound recheck. Patient was understanding and agrees with plan.  Fayrene Helper, PA-C 12/24/15 2314  Lyndal Pulley, MD 12/25/15 1256

## 2016-03-13 ENCOUNTER — Emergency Department (HOSPITAL_COMMUNITY): Payer: Medicaid Other

## 2016-03-13 ENCOUNTER — Emergency Department (HOSPITAL_COMMUNITY)
Admission: EM | Admit: 2016-03-13 | Discharge: 2016-03-13 | Disposition: A | Discharge status: Home / Self Care | Payer: Medicaid Other | Attending: Emergency Medicine | Admitting: Emergency Medicine

## 2016-03-13 ENCOUNTER — Encounter (HOSPITAL_COMMUNITY): Payer: Self-pay | Admitting: Emergency Medicine

## 2016-03-13 DIAGNOSIS — F17210 Nicotine dependence, cigarettes, uncomplicated: Secondary | ICD-10-CM | POA: Diagnosis not present

## 2016-03-13 DIAGNOSIS — R0602 Shortness of breath: Secondary | ICD-10-CM | POA: Diagnosis present

## 2016-03-13 DIAGNOSIS — K5090 Crohn's disease, unspecified, without complications: Secondary | ICD-10-CM | POA: Diagnosis not present

## 2016-03-13 DIAGNOSIS — R0789 Other chest pain: Secondary | ICD-10-CM

## 2016-03-13 DIAGNOSIS — Z791 Long term (current) use of non-steroidal anti-inflammatories (NSAID): Secondary | ICD-10-CM | POA: Diagnosis not present

## 2016-03-13 DIAGNOSIS — Z79899 Other long term (current) drug therapy: Secondary | ICD-10-CM | POA: Insufficient documentation

## 2016-03-13 DIAGNOSIS — Z79891 Long term (current) use of opiate analgesic: Secondary | ICD-10-CM | POA: Insufficient documentation

## 2016-03-13 DIAGNOSIS — R072 Precordial pain: Secondary | ICD-10-CM | POA: Diagnosis not present

## 2016-03-13 DIAGNOSIS — Z8673 Personal history of transient ischemic attack (TIA), and cerebral infarction without residual deficits: Secondary | ICD-10-CM | POA: Insufficient documentation

## 2016-03-13 DIAGNOSIS — K210 Gastro-esophageal reflux disease with esophagitis: Secondary | ICD-10-CM | POA: Insufficient documentation

## 2016-03-13 DIAGNOSIS — Z792 Long term (current) use of antibiotics: Secondary | ICD-10-CM | POA: Insufficient documentation

## 2016-03-13 DIAGNOSIS — I10 Essential (primary) hypertension: Secondary | ICD-10-CM | POA: Diagnosis not present

## 2016-03-13 DIAGNOSIS — K219 Gastro-esophageal reflux disease without esophagitis: Secondary | ICD-10-CM

## 2016-03-13 LAB — BASIC METABOLIC PANEL
Anion gap: 9 (ref 5–15)
BUN: 12 mg/dL (ref 6–20)
CALCIUM: 9.3 mg/dL (ref 8.9–10.3)
CHLORIDE: 106 mmol/L (ref 101–111)
CO2: 22 mmol/L (ref 22–32)
CREATININE: 0.75 mg/dL (ref 0.44–1.00)
GFR calc non Af Amer: >60 mL/min (ref >60)
Glucose, Bld: 102 mg/dL — ABNORMAL HIGH (ref 65–99)
Potassium: 4 mmol/L (ref 3.5–5.1)
Sodium: 137 mmol/L (ref 135–145)

## 2016-03-13 LAB — CBC
HCT: 39 % (ref 36.0–46.0)
Hemoglobin: 13.7 g/dL (ref 12.0–15.0)
MCH: 28.9 pg (ref 26.0–34.0)
MCHC: 35.1 g/dL (ref 30.0–36.0)
MCV: 82.3 fL (ref 78.0–100.0)
PLATELETS: 319 10*3/uL (ref 150–400)
RBC: 4.74 MIL/uL (ref 3.87–5.11)
RDW: 13.5 % (ref 11.5–15.5)
WBC: 5.5 10*3/uL (ref 4.0–10.5)

## 2016-03-13 LAB — I-STAT TROPONIN, ED: TROPONIN I, POC: 0.01 ng/mL (ref 0.00–0.08)

## 2016-03-13 MED ORDER — ACETAMINOPHEN 325 MG PO TABS
650.0000 mg | ORAL_TABLET | Freq: Once | ORAL | Status: AC
  Administered 2016-03-13: 650 mg via ORAL
  Filled 2016-03-13: qty 2

## 2016-03-13 MED ORDER — FAMOTIDINE 20 MG PO TABS
20.0000 mg | ORAL_TABLET | Freq: Once | ORAL | Status: AC
  Administered 2016-03-13: 20 mg via ORAL
  Filled 2016-03-13: qty 1

## 2016-03-13 MED ORDER — ALUM & MAG HYDROXIDE-SIMETH 200-200-20 MG/5ML PO SUSP
30.0000 mL | Freq: Once | ORAL | Status: AC
  Administered 2016-03-13: 30 mL via ORAL
  Filled 2016-03-13: qty 30

## 2016-03-13 MED ORDER — PANTOPRAZOLE SODIUM 40 MG PO TBEC: 40.0000 mg | DELAYED_RELEASE_TABLET | Freq: Every day | ORAL | Status: DC

## 2016-03-13 NOTE — ED Notes (Addendum)
Pt c/o right-central chest pain that has been on-going since December. States the pain has gone from intermittent to continuous and has been worsening. Says its worse with deep inspiration, and she has to sleep sitting up. C/o SOB. Denies N/V, back pain, weakness. Hx of HTN and smoking

## 2016-03-13 NOTE — ED Provider Notes (Signed)
CSN: [id number]     Arrival date & time 03/13/16  1191 History   First MD Initiated Contact with Patient 03/13/16 [phone]     Chief Complaint  Patient presents with  . Chest Pain  . Shortness of Breath     (Consider location/radiation/quality/duration/timing/severity/associated sxs/prior Treatment) Patient is a 35 y.o. female presenting with chest pain and shortness of breath. The history is provided by the patient.  Chest Pain Associated symptoms: shortness of breath   Associated symptoms: no abdominal pain, no back pain, no cough, no fever, no headache and not vomiting   Shortness of Breath Associated symptoms: chest pain   Associated symptoms: no abdominal pain, no cough, no fever, no headaches, no neck pain, no rash, no sore throat and no vomiting   Patient c/o chest pain for the past few months. Mid chest, dull constant, persistent, worse at night.   At times is burning sensation, midline/sternal location.  Not pleuritic. No association w activity or exertion. No cough or sore throat. No fever or chills. Denies hx gerd. Mild sob/doe. Denies associated diaphoresis or nausea/vomiting.  Patient denies hx asthma, or heart disease. No fam hx premature cad. +smoker. No leg pain or swelling. No recent immobility, travel, trauma, or hx dvt or pe.      Past Medical History  Diagnosis Date  . Crohn's disease (HCC)   . Stroke (HCC)   . Migraine   . Bell's palsy   . Hypertension    Past Surgical History  Procedure Laterality Date  . Rotary cuff repair     History reviewed. No pertinent family history. Social History  Substance Use Topics  . Smoking status: Current Every Day Smoker -- 1.00 packs/day for 14 years    Types: Cigarettes  . Smokeless tobacco: None  . Alcohol Use: No   OB History    Gravida Para Term Preterm AB TAB SAB Ectopic Multiple Living   4 3   1 1    3      Review of Systems  Constitutional: Negative for fever and chills.  HENT: Negative for sore throat.    Eyes: Negative for redness.  Respiratory: Positive for shortness of breath. Negative for cough.   Cardiovascular: Positive for chest pain. Negative for leg swelling.  Gastrointestinal: Negative for vomiting, abdominal pain and diarrhea.  Endocrine: Negative for polyuria.  Genitourinary: Negative for flank pain.  Musculoskeletal: Negative for back pain and neck pain.  Skin: Negative for rash.  Neurological: Negative for headaches.  Hematological: Does not bruise/bleed easily.  Psychiatric/Behavioral: Negative for confusion.      Allergies  Review of patient's allergies indicates no known allergies.  Home Medications   Prior to Admission medications   Medication Sig Start Date End Date Taking? Authorizing Provider  diphenhydrAMINE (BENADRYL) 25 MG tablet Take 25 mg by mouth every 6 (six) hours as needed for itching or allergies.   Yes Historical Provider, MD  ibuprofen (ADVIL,MOTRIN) 200 MG tablet Take 600 mg by mouth every 6 (six) hours as needed for moderate pain.   Yes Historical Provider, MD  lisinopril (PRINIVIL,ZESTRIL) 5 MG tablet Take 5 mg by mouth daily.  10/10/15  Yes Historical Provider, MD  metoprolol tartrate (LOPRESSOR) 25 MG tablet Take 25 mg by mouth daily.  10/10/15  Yes Historical Provider, MD  Multiple Vitamins-Minerals (MULTIVITAMIN & MINERAL PO) Take 1 tablet by mouth daily.   Yes Historical Provider, MD  Omega-3 Fatty Acids (FISH OIL) 1000 MG CAPS Take 1 capsule by mouth daily.  Yes Historical Provider, MD  cephALEXin (KEFLEX) 500 MG capsule Take 2 capsules (1,000 mg total) by mouth 2 (two) times daily. Patient not taking: Reported on 03/13/2016 12/24/15   Fayrene Helper, PA-C  traMADol (ULTRAM) 50 MG tablet Take 1 tablet (50 mg total) by mouth every 6 (six) hours as needed. Patient not taking: Reported on 03/13/2016 12/24/15   Fayrene Helper, PA-C   BP 160/107 mmHg  Pulse 83  Temp(Src) 98.2 F (36.8 C) (Oral)  Resp 14  Ht  (1.651 m)  Wt 80.74 kg  BMI 29.62 kg/m2   SpO2 99%  LMP 02/24/2016 Physical Exam  Constitutional: She appears well-developed and well-nourished. No distress.  HENT:  Mouth/Throat: Oropharynx is clear and moist.  Eyes: Conjunctivae are normal. No scleral icterus.  Neck: Neck supple. No JVD present. No tracheal deviation present. No thyromegaly present.  Cardiovascular: Normal rate, regular rhythm, normal heart sounds and intact distal pulses.  Exam reveals no gallop and no friction rub.   No murmur heard. Pulmonary/Chest: Effort normal and breath sounds normal. No respiratory distress.  Abdominal: Soft. Normal appearance and bowel sounds are normal. She exhibits no distension. There is no tenderness.  Musculoskeletal: She exhibits no edema or tenderness.  Neurological: She is alert.  Skin: Skin is warm and dry. No rash noted. She is not diaphoretic.  Psychiatric: She has a normal mood and affect.  Nursing note and vitals reviewed.   ED Course  Procedures (including critical care time) Labs Review   Results for orders placed or performed during the hospital encounter of 03/13/16  Basic metabolic panel  Result Value Ref Range   Sodium 137 135 - 145 mmol/L   Potassium 4.0 3.5 - 5.1 mmol/L   Chloride 106 101 - 111 mmol/L   CO2 22 22 - 32 mmol/L   Glucose, Bld 102 (H) 65 - 99 mg/dL   BUN 12 6 - 20 mg/dL   Creatinine, Ser 9.81 0.44 - 1.00 mg/dL   Calcium 9.3 8.9 - 19.1 mg/dL   GFR calc non Af Amer >60 >60 mL/min   GFR calc Af Amer >60 >60 mL/min   Anion gap 9 5 - 15  CBC  Result Value Ref Range   WBC 5.5 4.0 - 10.5 K/uL   RBC 4.74 3.87 - 5.11 MIL/uL   Hemoglobin 13.7 12.0 - 15.0 g/dL   HCT 47.8 29.5 - 62.1 %   MCV 82.3 78.0 - 100.0 fL   MCH 28.9 26.0 - 34.0 pg   MCHC 35.1 30.0 - 36.0 g/dL   RDW 30.8 65.7 - 84.6 %   Platelets 319 150 - 400 K/uL  I-stat troponin, ED  Result Value Ref Range   Troponin i, poc 0.01 0.00 - 0.08 ng/mL   Comment 3           Dg Chest 2 View  03/13/2016  CLINICAL DATA:  Rt to mid chest  pain for approx 4-5 months, has gone from intermittent to continuous, with worsening pain over last few days, states smokes a lot, hx of htn EXAM: CHEST  2 VIEW COMPARISON:  01/11/2010 FINDINGS: The heart size and mediastinal contours are within normal limits. Both lungs are clear. No pleural effusion or pneumothorax. The visualized skeletal structures are unremarkable. IMPRESSION: Normal chest radiographs. Electronically Signed   By: Amie Portland M.D.   On: 03/13/2016 09:06       I have personally reviewed and evaluated these images and lab results as part of my medical decision-making.  EKG Interpretation   Date/Time:  Tuesday Mar 13 2016 08:24:15 EDT Ventricular Rate:  81 PR Interval:  165 QRS Duration: 75 QT Interval:  370 QTC Calculation: 429 R Axis:   82 Text Interpretation:  Sinus rhythm Normal ECG No previous tracing  Confirmed by Denton Lank  MD, Caryn Bee ([id number]) on 03/13/2016 9:10:40 AM      MDM   Labs. Cxr.  Reviewed nursing notes and prior charts for additional history.   After prolonged period chest pain, trop neg.    Patient notes burning sensation chest.  Maalox, pepcid, and tylenol given for symptom relief.  Symptoms, eval/results, do not appear c/w acs.    Given atypical chest pain, high bp, will have f/u pcp closely.  As recurrent pain, will also give referral to cardiology for outpatient f/u.   rec smoking cessation, f/u bp control, rx possible gerd, and outpatient follow up.  Patient currently appears stable for d/c.      Cathren Laine, MD 03/13/16 1015

## 2016-03-13 NOTE — Discharge Instructions (Signed)
It was our pleasure to provide your ER care today - we hope that you feel better.  Take protonix (acid blocker medication).  You may also try maalox, gas-x, or pepcid as need if heartburn/reflux symptoms.  Take tylenol or advil as need.  Follow up with primary care doctor in the coming week - see referral - call to arrange appointment.  Your blood pressure is high today - continue blood pressure medication, and follow up with primary care doctor in coming week.  Avoid any smoking.   Also, for chest discomfort, follow up with cardiologist in the coming week - see referral - call to arrange appointment.  Return to ER if worse, new symptoms, fevers, trouble breathing, recurrent/persistent chest pain, weak/fainting, other concern.       Nonspecific Chest Pain  Chest pain can be caused by many different conditions. There is always a chance that your pain could be related to something serious, such as a heart attack or a blood clot in your lungs. Chest pain can also be caused by conditions that are not life-threatening. If you have chest pain, it is very important to follow up with your health care provider. CAUSES  Chest pain can be caused by:  Heartburn.  Pneumonia or bronchitis.  Anxiety or stress.  Inflammation around your heart (pericarditis) or lung (pleuritis or pleurisy).  A blood clot in your lung.  A collapsed lung (pneumothorax). It can develop suddenly on its own (spontaneous pneumothorax) or from trauma to the chest.  Shingles infection (varicella-zoster virus).  Heart attack.  Damage to the bones, muscles, and cartilage that make up your chest wall. This can include:  Bruised bones due to injury.  Strained muscles or cartilage due to frequent or repeated coughing or overwork.  Fracture to one or more ribs.  Sore cartilage due to inflammation (costochondritis). RISK FACTORS  Risk factors for chest pain may include:  Activities that increase your risk for  trauma or injury to your chest.  Respiratory infections or conditions that cause frequent coughing.  Medical conditions or overeating that can cause heartburn.  Heart disease or family history of heart disease.  Conditions or health behaviors that increase your risk of developing a blood clot.  Having had chicken pox (varicella zoster). SIGNS AND SYMPTOMS Chest pain can feel like:  Burning or tingling on the surface of your chest or deep in your chest.  Crushing, pressure, aching, or squeezing pain.  Dull or sharp pain that is worse when you move, cough, or take a deep breath.  Pain that is also felt in your back, neck, shoulder, or arm, or pain that spreads to any of these areas. Your chest pain may come and go, or it may stay constant. DIAGNOSIS Lab tests or other studies may be needed to find the cause of your pain. Your health care provider may have you take a test called an ambulatory ECG (electrocardiogram). An ECG records your heartbeat patterns at the time the test is performed. You may also have other tests, such as:  Transthoracic echocardiogram (TTE). During echocardiography, sound waves are used to create a picture of all of the heart structures and to look at how blood flows through your heart.  Transesophageal echocardiogram (TEE).This is a more advanced imaging test that obtains images from inside your body. It allows your health care provider to see your heart in finer detail.  Cardiac monitoring. This allows your health care provider to monitor your heart rate and rhythm in real time.  Holter monitor. This is a portable device that records your heartbeat and can help to diagnose abnormal heartbeats. It allows your health care provider to track your heart activity for several days, if needed.  Stress tests. These can be done through exercise or by taking medicine that makes your heart beat more quickly.  Blood tests.  Imaging tests. TREATMENT  Your treatment  depends on what is causing your chest pain. Treatment may include:  Medicines. These may include:  Acid blockers for heartburn.  Anti-inflammatory medicine.  Pain medicine for inflammatory conditions.  Antibiotic medicine, if an infection is present.  Medicines to dissolve blood clots.  Medicines to treat coronary artery disease.  Supportive care for conditions that do not require medicines. This may include:  Resting.  Applying heat or cold packs to injured areas.  Limiting activities until pain decreases. HOME CARE INSTRUCTIONS  If you were prescribed an antibiotic medicine, finish it all even if you start to feel better.  Avoid any activities that bring on chest pain.  Do not use any tobacco products, including cigarettes, chewing tobacco, or electronic cigarettes. If you need help quitting, ask your health care provider.  Do not drink alcohol.  Take medicines only as directed by your health care provider.  Keep all follow-up visits as directed by your health care provider. This is important. This includes any further testing if your chest pain does not go away.  If heartburn is the cause for your chest pain, you may be told to keep your head raised (elevated) while sleeping. This reduces the chance that acid will go from your stomach into your esophagus.  Make lifestyle changes as directed by your health care provider. These may include:  Getting regular exercise. Ask your health care provider to suggest some activities that are safe for you.  Eating a heart-healthy diet. A registered dietitian can help you to learn healthy eating options.  Maintaining a healthy weight.  Managing diabetes, if necessary.  Reducing stress. SEEK MEDICAL CARE IF:  Your chest pain does not go away after treatment.  You have a rash with blisters on your chest.  You have a fever. SEEK IMMEDIATE MEDICAL CARE IF:   Your chest pain is worse.  You have an increasing cough, or you  cough up blood.  You have severe abdominal pain.  You have severe weakness.  You faint.  You have chills.  You have sudden, unexplained chest discomfort.  You have sudden, unexplained discomfort in your arms, back, neck, or jaw.  You have shortness of breath at any time.  You suddenly start to sweat, or your skin gets clammy.  You feel nauseous or you vomit.  You suddenly feel light-headed or dizzy.  Your heart begins to beat quickly, or it feels like it is skipping beats. These symptoms may represent a serious problem that is an emergency. Do not wait to see if the symptoms will go away. Get medical help right away. Call your local emergency services (911 in the [place] Do not drive yourself to the hospital.   This information is not intended to replace advice given to you by your health care provider. Make sure you discuss any questions you have with your health care provider.   Document Released: 08/08/2005 Document Revised: 11/19/2014 Document Reviewed: 06/04/2014 Elsevier Interactive Patient Education 2016 [employer]    Gastroesophageal Reflux Disease, Adult Normally, food travels down the esophagus and stays in the stomach to be digested. However, when a person has gastroesophageal  reflux disease (GERD), food and stomach acid move back up into the esophagus. When this happens, the esophagus becomes sore and inflamed. Over time, GERD can create small holes (ulcers) in the lining of the esophagus.  CAUSES This condition is caused by a problem with the muscle between the esophagus and the stomach (lower esophageal sphincter, or LES). Normally, the LES muscle closes after food passes through the esophagus to the stomach. When the LES is weakened or abnormal, it does not close properly, and that allows food and stomach acid to go back up into the esophagus. The LES can be weakened by certain dietary substances, medicines, and medical conditions, including:  Tobacco  use.  Pregnancy.  Having a hiatal hernia.  Heavy alcohol use.  Certain foods and beverages, such as coffee, chocolate, onions, and peppermint. RISK FACTORS This condition is more likely to develop in:  People who have an increased body weight.  People who have connective tissue disorders.  People who use NSAID medicines. SYMPTOMS Symptoms of this condition include:  Heartburn.  Difficult or painful swallowing.  The feeling of having a lump in the throat.  Abitter taste in the mouth.  Bad breath.  Having a large amount of saliva.  Having an upset or bloated stomach.  Belching.  Chest pain.  Shortness of breath or wheezing.  Ongoing (chronic) cough or a night-time cough.  Wearing away of tooth enamel.  Weight loss. Different conditions can cause chest pain. Make sure to see your health care provider if you experience chest pain. DIAGNOSIS Your health care provider will take a medical history and perform a physical exam. To determine if you have mild or severe GERD, your health care provider may also monitor how you respond to treatment. You may also have other tests, including:  An endoscopy toexamine your stomach and esophagus with a small camera.  A test thatmeasures the acidity level in your esophagus.  A test thatmeasures how much pressure is on your esophagus.  A barium swallow or modified barium swallow to show the shape, size, and functioning of your esophagus. TREATMENT The goal of treatment is to help relieve your symptoms and to prevent complications. Treatment for this condition may vary depending on how severe your symptoms are. Your health care provider may recommend:  Changes to your diet.  Medicine.  Surgery. HOME CARE INSTRUCTIONS Diet  Follow a diet as recommended by your health care provider. This may involve avoiding foods and drinks such as:  Coffee and tea (with or without caffeine).  Drinks that containalcohol.  Energy  drinks and sports drinks.  Carbonated drinks or sodas.  Chocolate and cocoa.  Peppermint and mint flavorings.  Garlic and onions.  Horseradish.  Spicy and acidic foods, including peppers, chili powder, curry powder, vinegar, hot sauces, and barbecue sauce.  Citrus fruit juices and citrus fruits, such as oranges, lemons, and limes.  Tomato-based foods, such as red sauce, chili, salsa, and pizza with red sauce.  Fried and fatty foods, such as donuts, french fries, potato chips, and high-fat dressings.  High-fat meats, such as hot dogs and fatty cuts of red and white meats, such as rib eye steak, sausage, ham, and bacon.  High-fat dairy items, such as whole milk, butter, and cream cheese.  Eat small, frequent meals instead of large meals.  Avoid drinking large amounts of liquid with your meals.  Avoid eating meals during the 2-3 hours before bedtime.  Avoid lying down right after you eat.  Do not exercise  right after you eat. General Instructions  Pay attention to any changes in your symptoms.  Take over-the-counter and prescription medicines only as told by your health care provider. Do not take aspirin, ibuprofen, or other NSAIDs unless your health care provider told you to do so.  Do not use any tobacco products, including cigarettes, chewing tobacco, and e-cigarettes. If you need help quitting, ask your health care provider.  Wear loose-fitting clothing. Do not wear anything tight around your waist that causes pressure on your abdomen.  Raise (elevate) the head of your bed 6 inches (15cm).  Try to reduce your stress, such as with yoga or meditation. If you need help reducing stress, ask your health care provider.  If you are overweight, reduce your weight to an amount that is healthy for you. Ask your health care provider for guidance about a safe weight loss goal.  Keep all follow-up visits as told by your health care provider. This is important. SEEK MEDICAL  CARE IF:  You have new symptoms.  You have unexplained weight loss.  You have difficulty swallowing, or it hurts to swallow.  You have wheezing or a persistent cough.  Your symptoms do not improve with treatment.  You have a hoarse voice. SEEK IMMEDIATE MEDICAL CARE IF:  You have pain in your arms, neck, jaw, teeth, or back.  You feel sweaty, dizzy, or light-headed.  You have chest pain or shortness of breath.  You vomit and your vomit looks like blood or coffee grounds.  You faint.  Your stool is bloody or black.  You cannot swallow, drink, or eat.   This information is not intended to replace advice given to you by your health care provider. Make sure you discuss any questions you have with your health care provider.   Document Released: 08/08/2005 Document Revised: 07/20/2015 Document Reviewed: 02/23/2015 Elsevier Interactive Patient Education 2016 [employer].    Hypertension Hypertension, commonly called high blood pressure, is when the force of blood pumping through your arteries is too strong. Your arteries are the blood vessels that carry blood from your heart throughout your body. A blood pressure reading consists of a higher number over a lower number, such as 110/72. The higher number (systolic) is the pressure inside your arteries when your heart pumps. The lower number (diastolic) is the pressure inside your arteries when your heart relaxes. Ideally you want your blood pressure below 120/80. Hypertension forces your heart to work harder to pump blood. Your arteries may become narrow or stiff. Having untreated or uncontrolled hypertension can cause heart attack, stroke, kidney disease, and other problems. RISK FACTORS Some risk factors for high blood pressure are controllable. Others are not.  Risk factors you cannot control include:   Race. You may be at higher risk if you are African American.  Age. Risk increases with age.  Gender. Men are at higher  risk than women before age 46 years. After age 63, women are at higher risk than men. Risk factors you can control include:  Not getting enough exercise or physical activity.  Being overweight.  Getting too much fat, sugar, calories, or salt in your diet.  Drinking too much alcohol. SIGNS AND SYMPTOMS Hypertension does not usually cause signs or symptoms. Extremely high blood pressure (hypertensive crisis) may cause headache, anxiety, shortness of breath, and nosebleed. DIAGNOSIS To check if you have hypertension, your health care provider will measure your blood pressure while you are seated, with your arm held at the level of  your heart. It should be measured at least twice using the same arm. Certain conditions can cause a difference in blood pressure between your right and left arms. A blood pressure reading that is higher than normal on one occasion does not mean that you need treatment. If it is not clear whether you have high blood pressure, you may be asked to return on a different day to have your blood pressure checked again. Or, you may be asked to monitor your blood pressure at home for 1 or more weeks. TREATMENT Treating high blood pressure includes making lifestyle changes and possibly taking medicine. Living a healthy lifestyle can help lower high blood pressure. You may need to change some of your habits. Lifestyle changes may include:  Following the DASH diet. This diet is high in fruits, vegetables, and whole grains. It is low in salt, red meat, and added sugars.  Keep your sodium intake below 2,300 mg per day.  Getting at least 30-45 minutes of aerobic exercise at least 4 times per week.  Losing weight if necessary.  Not smoking.  Limiting alcoholic beverages.  Learning ways to reduce stress. Your health care provider may prescribe medicine if lifestyle changes are not enough to get your blood pressure under control, and if one of the following is true:  You are  62-69 years of age and your systolic blood pressure is above 140.  You are 31 years of age or older, and your systolic blood pressure is above 150.  Your diastolic blood pressure is above 90.  You have diabetes, and your systolic blood pressure is over 140 or your diastolic blood pressure is over 90.  You have kidney disease and your blood pressure is above 140/90.  You have heart disease and your blood pressure is above 140/90. Your personal target blood pressure may vary depending on your medical conditions, your age, and other factors. HOME CARE INSTRUCTIONS  Have your blood pressure rechecked as directed by your health care provider.   Take medicines only as directed by your health care provider. Follow the directions carefully. Blood pressure medicines must be taken as prescribed. The medicine does not work as well when you skip doses. Skipping doses also puts you at risk for problems.  Do not smoke.   Monitor your blood pressure at home as directed by your health care provider. SEEK MEDICAL CARE IF:   You think you are having a reaction to medicines taken.  You have recurrent headaches or feel dizzy.  You have swelling in your ankles.  You have trouble with your vision. SEEK IMMEDIATE MEDICAL CARE IF:  You develop a severe headache or confusion.  You have unusual weakness, numbness, or feel faint.  You have severe chest or abdominal pain.  You vomit repeatedly.  You have trouble breathing. MAKE SURE YOU:   Understand these instructions.  Will watch your condition.  Will get help right away if you are not doing well or get worse.   This information is not intended to replace advice given to you by your health care provider. Make sure you discuss any questions you have with your health care provider.   Document Released: 10/29/2005 Document Revised: 03/15/2015 Document Reviewed: 08/21/2013 Elsevier Interactive Patient Education 2016 [employer].    Smoking Hazards Smoking cigarettes is extremely bad for your health. Tobacco smoke has over 200 known poisons in it. It contains the poisonous gases nitrogen oxide and carbon monoxide. There are over 60 chemicals in tobacco smoke  that cause cancer. Some of the chemicals found in cigarette smoke include:   Cyanide.   Benzene.   Formaldehyde.   Methanol (wood alcohol).   Acetylene (fuel used in welding torches).   Ammonia.  Even smoking lightly shortens your life expectancy by several years. You can greatly reduce the risk of medical problems for you and your family by stopping now. Smoking is the most preventable cause of death and disease in our society. Within days of quitting smoking, your circulation improves, you decrease the risk of having a heart attack, and your lung capacity improves. There may be some increased phlegm in the first few days after quitting, and it may take months for your lungs to clear up completely. Quitting for 10 years reduces your risk of developing lung cancer to almost that of a nonsmoker.  WHAT ARE THE RISKS OF SMOKING? Cigarette smokers have an increased risk of many serious medical problems, including:  Lung cancer.   Lung disease (such as pneumonia, bronchitis, and emphysema).   Heart attack and chest pain due to the heart not getting enough oxygen (angina).   Heart disease and peripheral blood vessel disease.   Hypertension.   Stroke.   Oral cancer (cancer of the lip, mouth, or voice box).   Bladder cancer.   Pancreatic cancer.   Cervical cancer.   Pregnancy complications, including premature birth.   Stillbirths and smaller newborn babies, birth defects, and genetic damage to sperm.   Early menopause.   Lower estrogen level for women.   Infertility.   Facial wrinkles.   Blindness.   Increased risk of broken bones (fractures).   Senile dementia.   Stomach ulcers and internal bleeding.    Delayed wound healing and increased risk of complications during surgery. Because of secondhand smoke exposure, children of smokers have an increased risk of the following:   Sudden infant death syndrome (SIDS).   Respiratory infections.   Lung cancer.   Heart disease.   Ear infections.  WHY IS SMOKING ADDICTIVE? Nicotine is the chemical agent in tobacco that is capable of causing addiction or dependence. When you smoke and inhale, nicotine is absorbed rapidly into the bloodstream through your lungs. Both inhaled and noninhaled nicotine may be addictive.  WHAT ARE THE BENEFITS OF QUITTING?  There are many health benefits to quitting smoking. Some are:   The likelihood of developing cancer and heart disease decreases. Health improvements are seen almost immediately.   Blood pressure, pulse rate, and breathing patterns start returning to normal soon after quitting.   People who quit may see an improvement in their overall quality of life.  HOW DO YOU QUIT SMOKING? Smoking is an addiction with both physical and psychological effects, and longtime habits can be hard to change. Your health care provider can recommend:  Programs and community resources, which may include group support, education, or therapy.  Replacement products, such as patches, gum, and nasal sprays. Use these products only as directed. Do not replace cigarette smoking with electronic cigarettes (commonly called e-cigarettes). The safety of e-cigarettes is unknown, and some may contain harmful chemicals. FOR MORE INFORMATION  American Lung Association: www.lung.org  American Cancer Society: www.cancer.org   This information is not intended to replace advice given to you by your health care provider. Make sure you discuss any questions you have with your health care provider.   Document Released: 12/06/2004 Document Revised: 08/19/2013 Document Reviewed: 04/20/2013 Elsevier Interactive Patient Education  [employer]

## 2017-08-29 IMAGING — CR DG CHEST 2V
2 series · 2 of 2 positions shown · non-contrast
Comparison: 01/11/2010

CLINICAL DATA: Rt to mid chest pain for approx 4-5 months, has gone
from intermittent to continuous, with worsening pain over last few
days, states smokes a lot, hx of htn

EXAM:
CHEST  2 VIEW

[w chest pa]
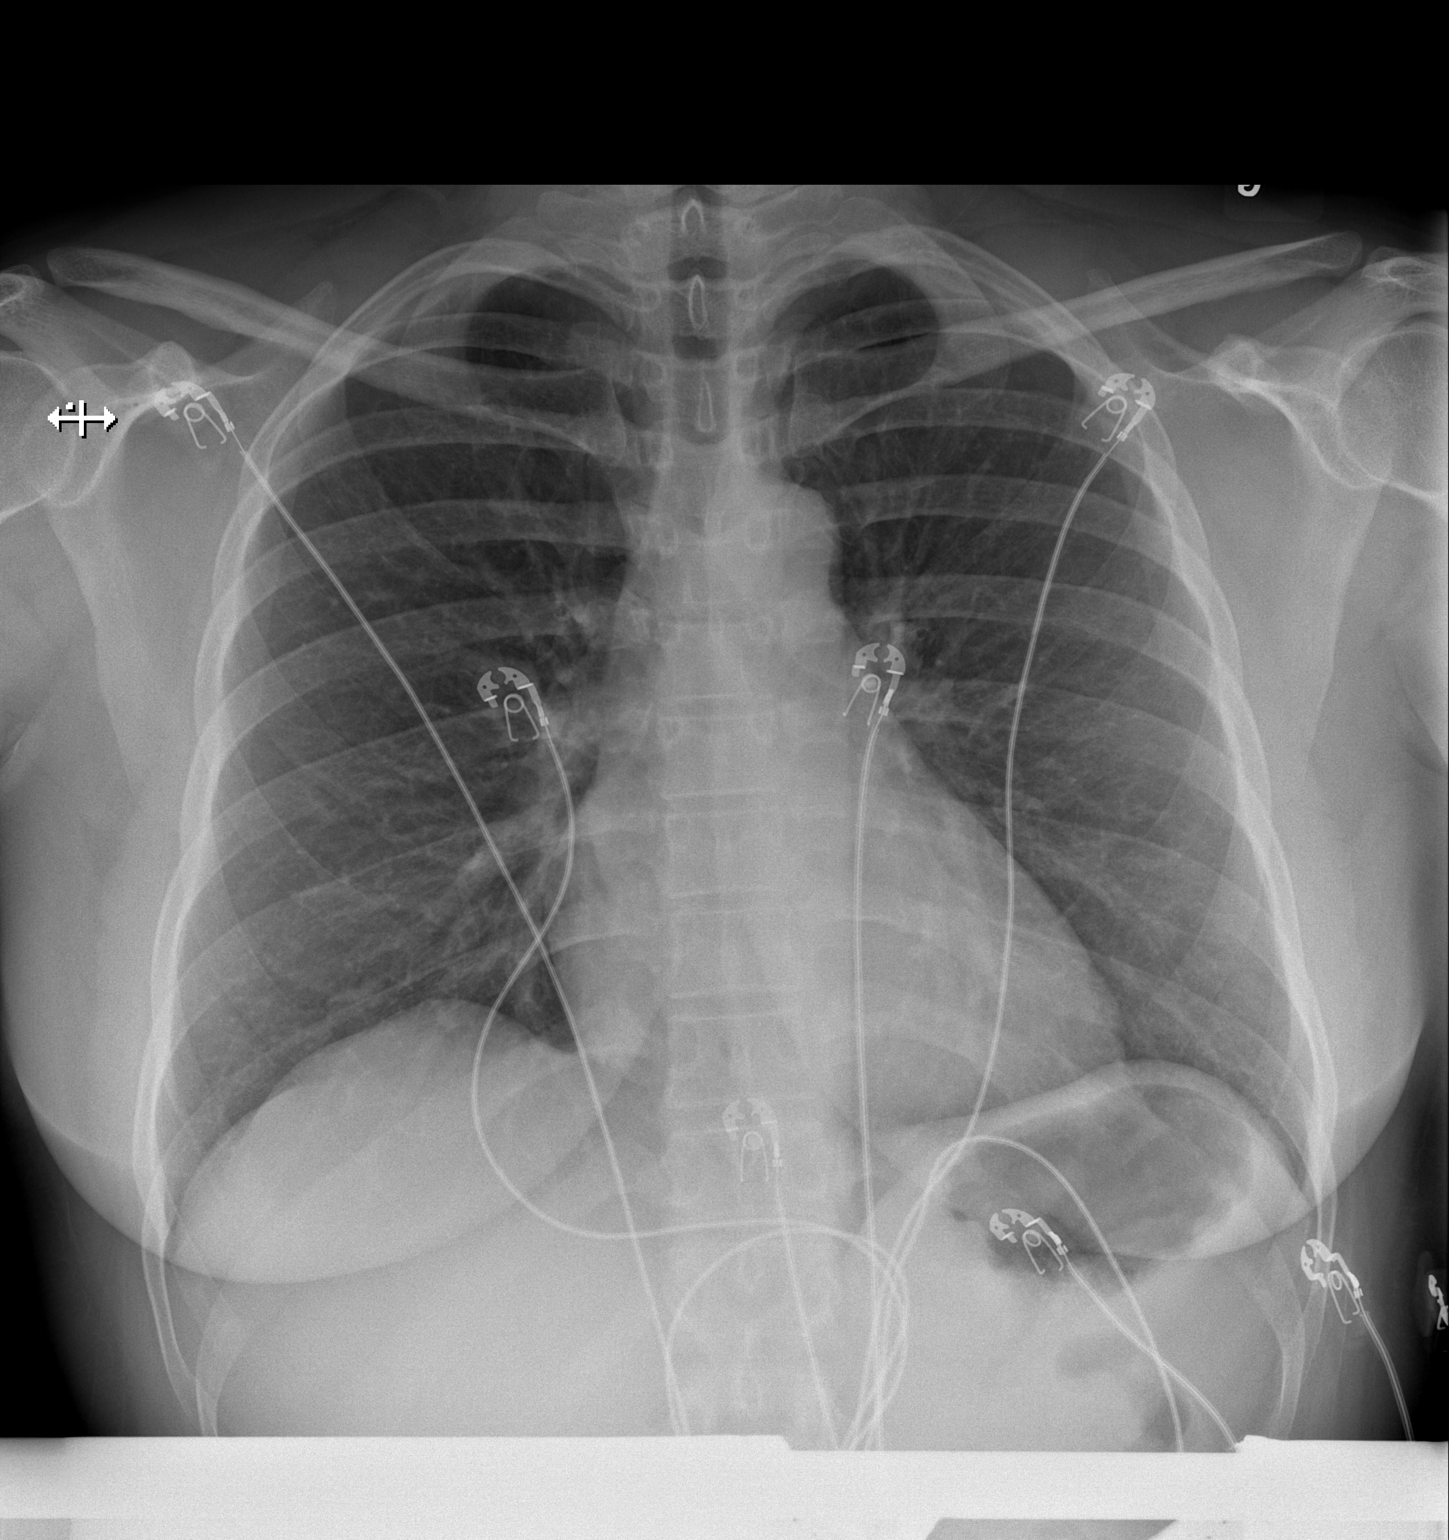

[w chest lat]
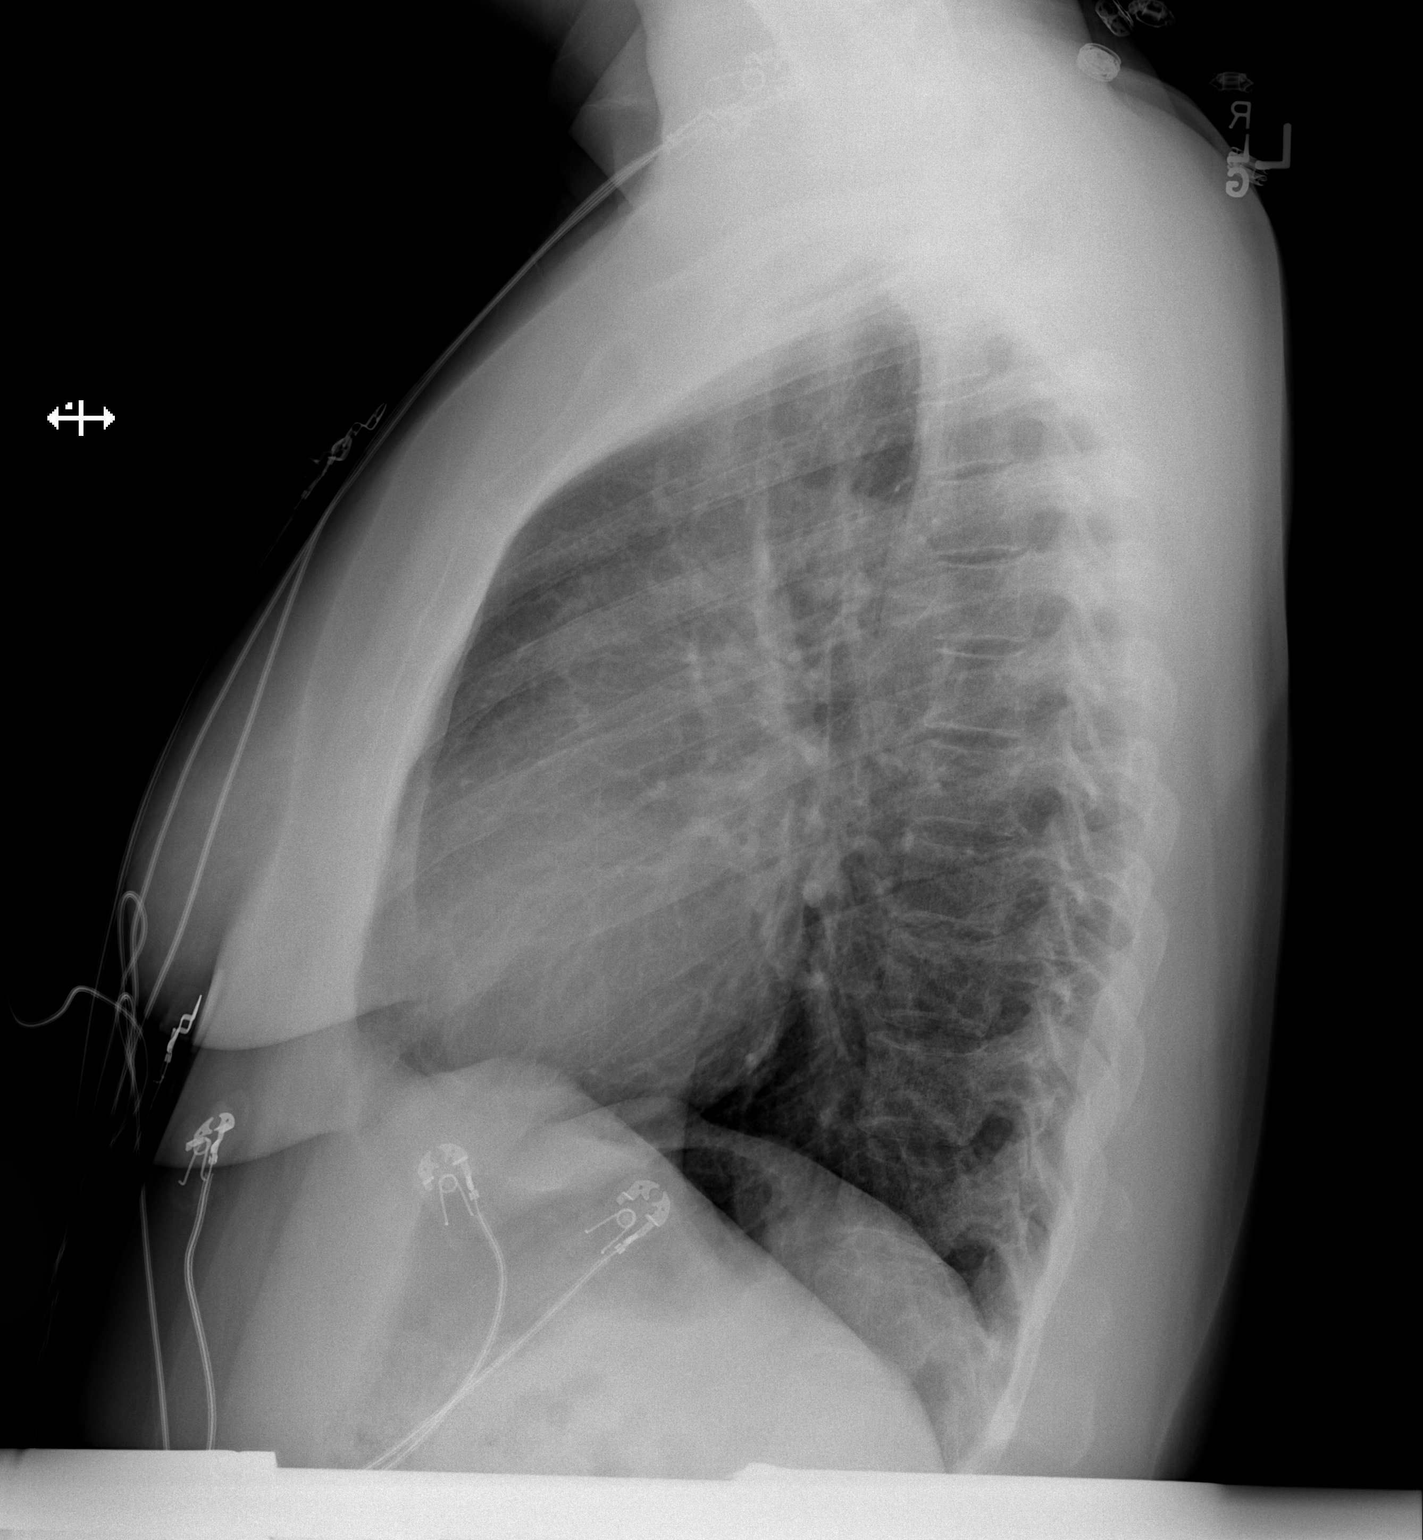

[2 of 2 positions shown; findings below may reference images not displayed]

FINDINGS: The heart size and mediastinal contours are within normal limits.
Both lungs are clear. No pleural effusion or pneumothorax. The
visualized skeletal structures are unremarkable.
IMPRESSION: Normal chest radiographs.

## 2019-06-12 ENCOUNTER — Encounter (HOSPITAL_COMMUNITY): Payer: Self-pay | Admitting: Emergency Medicine

## 2019-06-12 ENCOUNTER — Other Ambulatory Visit: Payer: Self-pay

## 2019-06-12 ENCOUNTER — Ambulatory Visit (HOSPITAL_COMMUNITY)
Admission: EM | Admit: 2019-06-12 | Discharge: 2019-06-12 | Disposition: A | Discharge status: Home / Self Care | Payer: Self-pay | Attending: Emergency Medicine | Admitting: Emergency Medicine

## 2019-06-12 DIAGNOSIS — I10 Essential (primary) hypertension: Secondary | ICD-10-CM

## 2019-06-12 DIAGNOSIS — M25511 Pain in right shoulder: Secondary | ICD-10-CM

## 2019-06-12 MED ORDER — CYCLOBENZAPRINE HCL 5 MG PO TABS: 5.0000 mg | ORAL_TABLET | Freq: Two times a day (BID) | ORAL | 0 refills | Status: DC | PRN

## 2019-06-12 MED ORDER — LISINOPRIL 5 MG PO TABS: 5.0000 mg | ORAL_TABLET | Freq: Every day | ORAL | 0 refills | Status: DC

## 2019-06-12 NOTE — ED Notes (Signed)
Patient able to ambulate independently  
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
## 2019-06-12 NOTE — Discharge Instructions (Addendum)
Take muscle relaxer as needed for severe pain, spasm. °May ice, rest, elevate area is causing most pain.  Can also use hot compresses/warm wash rags to relieve muscle tightness. °May use OTC Tylenol, ibuprofen as needed for pain. °Return if you develop worsening pain, chest pain, difficulty breathing. °

## 2019-12-17 ENCOUNTER — Other Ambulatory Visit: Payer: Self-pay

## 2019-12-17 ENCOUNTER — Ambulatory Visit: Payer: Medicaid Other | Admitting: Physician Assistant

## 2019-12-17 ENCOUNTER — Encounter: Payer: Self-pay | Admitting: Physician Assistant

## 2019-12-17 VITALS — BP 130/94 | HR 98 | Temp 98.3°F | Ht 65.5 in | Wt 192.0 lb

## 2019-12-17 DIAGNOSIS — Z8719 Personal history of other diseases of the digestive system: Secondary | ICD-10-CM

## 2019-12-17 DIAGNOSIS — R1084 Generalized abdominal pain: Secondary | ICD-10-CM | POA: Diagnosis not present

## 2019-12-17 DIAGNOSIS — K5900 Constipation, unspecified: Secondary | ICD-10-CM | POA: Diagnosis not present

## 2019-12-17 MED ORDER — LINACLOTIDE 290 MCG PO CAPS: 290.0000 ug | ORAL_CAPSULE | Freq: Every day | ORAL | 2 refills | Status: AC

## 2019-12-17 NOTE — Progress Notes (Signed)
Chief Complaint: Abdominal pain and constipation  HPI:    Angela Joyce is a 39 year old AA female with a past medical history as listed below including Crohn's disease, anxiety, and stroke, previously known to Dr. Jarold Motto, who was referred to me by Angela Canary, PA-C for a complaint of abdominal pain and constipation.     Patient has not been seen in our clinic since 2009.  At that time, it was noted the patient had a colonoscopy including ileal intubation in November 2005.  Ileal biopsy at that time were normal and serological studies for inflammatory bowel disease were negative.  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  She was started on Lotronex 0.5 mg twice a day.    10/23/2019 patient was positive for Covid.    Today, the patient presents to clinic and explains that over the past 5+ years she has always had "stomach pains".  Tells me that she has had contraction pain whenever having to pass of stool, recently over the past couple of months she has changed to constipation, noticed change after getting Covid in early December.  Tells me that since early December she has gained almost 50 pounds.  Tells me that she feels very bloated and always full and when she goes to have a bowel movement sometimes she will not have one at all but gets terrible 9-10/10 cramping pain which may or may not result in anything.  Does not feel like she gets a good bowel movement at all except for about once a week.  Her last 1 was about a week ago.  She does continue Metamucil and tried Ex-Lax over-the-counter which never helped.  Associated symptoms include nausea.    Denies fever, chills, weight loss, blood in her stool or symptoms that awaken her from sleep.  Past Medical History:  Diagnosis Date  . Anxiety   . Bell's palsy   . Depression   . Hypertension   . Migraine   . Stroke [facility])    Past Surgical History:  Procedure Laterality Date  . rotary cuff repair       Current Outpatient Medications  Medication Sig Dispense Refill  . cyclobenzaprine (FLEXERIL) 5 MG tablet Take 1 tablet (5 mg total) by mouth 2 (two) times daily as needed for muscle spasms. 14 tablet 0  . diphenhydrAMINE (BENADRYL) 25 MG tablet Take 25 mg by mouth every 6 (six) hours as needed for itching or allergies.    Marland Kitchen ibuprofen (ADVIL,MOTRIN) 200 MG tablet Take 600 mg by mouth every 6 (six) hours as needed for moderate pain.    Marland Kitchen lisinopril (ZESTRIL) 5 MG tablet Take 1 tablet (5 mg total) by mouth daily. 30 tablet 0  . metoprolol tartrate (LOPRESSOR) 25 MG tablet Take 25 mg by mouth daily.   0  . Multiple Vitamins-Minerals (MULTIVITAMIN & MINERAL PO) Take 1 tablet by mouth daily.    . Omega-3 Fatty Acids (FISH OIL) 1000 MG CAPS Take 1 capsule by mouth daily.    . pantoprazole (PROTONIX) 40 MG tablet Take 1 tablet (40 mg total) by mouth daily. 30 tablet 0   No current facility-administered medications for this visit.    Allergies as of 12/17/2019  . (No Known Allergies)    No family history on file.  Social History   Socioeconomic History  . Marital status: Single    Spouse name: Not on file  . Number of children: Not on file  . Years  of education: Not on file  . Highest education level: Not on file  Occupational History  . Not on file  Tobacco Use  . Smoking status: Current Every Day Smoker    Packs/day: 1.00    Years: 14.00    Pack years: 14.00    Types: Cigarettes  . Smokeless tobacco: Never Used  Substance and Sexual Activity  . Alcohol use: No  . Drug use: No  . Sexual activity: Never    Birth control/protection: None  Other Topics Concern  . Not on file  Social History Narrative  . Not on file   Social Determinants of Health   Financial Resource Strain:   . Difficulty of Paying Living Expenses: Not on file  Food Insecurity:   . Worried About Charity fundraiser in the Last Year: Not on file  . Ran Out of Food in the Last Year: Not on file   Transportation Needs:   . Lack of Transportation (Medical): Not on file  . Lack of Transportation (Non-Medical): Not on file  Physical Activity:   . Days of Exercise per Week: Not on file  . Minutes of Exercise per Session: Not on file  Stress:   . Feeling of Stress : Not on file  Social Connections:   . Frequency of Communication with Friends and Family: Not on file  . Frequency of Social Gatherings with Friends and Family: Not on file  . Attends Religious Services: Not on file  . Active Member of Clubs or Organizations: Not on file  . Attends Archivist Meetings: Not on file  . Marital Status: Not on file  Intimate Partner Violence:   . Fear of Current or Ex-Partner: Not on file  . Emotionally Abused: Not on file  . Physically Abused: Not on file  . Sexually Abused: Not on file    Review of Systems:    Constitutional: No weight loss, fever or chills Skin: No rash  Cardiovascular: No chest pain   Respiratory: No SOB  Gastrointestinal: See HPI and otherwise negative Genitourinary: No dysuria  Neurological: No headache Musculoskeletal: No new muscle or joint pain Hematologic: No bleeding  Psychiatric: +anxiety   Physical Exam:  Vital signs: BP (!) 130/94   Pulse 98   Temp 98.3 F (36.8 C)   Ht 5' 5.5" (1.664 m)   Wt 192 lb (87.1 kg)   BMI 31.46 kg/m   Constitutional:   Pleasant AA female appears to be in NAD, Well developed, Well nourished, alert and cooperative Head:  Normocephalic and atraumatic. Eyes:   PEERL, EOMI. No icterus. Conjunctiva pink. Ears:  Normal auditory acuity. Neck:  Supple Throat: Oral cavity and pharynx without inflammation, swelling or lesion.  Respiratory: Respirations even and unlabored. Lungs clear to auscultation bilaterally.   No wheezes, crackles, or rhonchi.  Cardiovascular: Normal S1, S2. No MRG. Regular rate and rhythm. No peripheral edema, cyanosis or pallor.  Gastrointestinal:  Soft, nondistended, mild generalized ttp. No  rebound or guarding. Normal bowel sounds. No appreciable masses or hepatomegaly. Rectal:  Not performed.  Msk:  Symmetrical without gross deformities. Without edema, no deformity or joint abnormality.  Neurologic:  Alert and  oriented x4;  grossly normal neurologically.  Skin:   Dry and intact without significant lesions or rashes. Psychiatric: Demonstrates good judgement and reason without abnormal affect or behaviors.  No recent labs or imaging.  Assessment: 1.  Constipation: Likely IBS-C 2.  Generalized abdominal cramping: Constant, worse with a bowel movement, likely related  to IBS 3.  History of IBS: With diarrhea back in 2009, now with constipation  Plan: 1.  Prescribed Linzess 290 mcg daily, 30 minutes before breakfast #30 with 5 refills.  Did provide the patient with 7 days of samples. 2.  Also provided patient with Suprep tabs today to do a bowel purge.  She should take 1 bottle of these tablets this evening and if she does not feel completely empty follow-up with the second bottle tomorrow. 3.  Patient had a previous diagnosis of Crohn's in her chart and tells me this came from Dr. Jarold Motto.  After in-depth review of her chart it does not appear that she was ever diagnosed with Crohn's, her last colonoscopy in 2005 showed no signs and she was diagnosed with IBS-D at that time.  Discussed this in detail and removed Crohn's from her medical problem list. 4.  Patient to follow in clinic with me in 4 to 6 weeks.  She was assigned to Dr. Leone Payor this morning.  If symptoms are no better could consider a colonoscopy and other lab evaluation.  Hyacinth Meeker, PA-C [facility] 12/17/2019, 9:05 AM  Cc: Angela Canary, PA-C

## 2019-12-17 NOTE — Patient Instructions (Addendum)
If you are age 39 or older, your body mass index should be between 23-30. Your Body mass index is 31.46 kg/m. If this is out of the aforementioned range listed, please consider follow up with your Primary Care Provider.  If you are age 67 or younger, your body mass index should be between 19-25. Your Body mass index is 31.46 kg/m. If this is out of the aformentioned range listed, please consider follow up with your Primary Care Provider.   We have sent the following medications to your pharmacy for you to pick up at your convenience: Linzess 290 mcg daily 30 minutes before breakfast.   Victorino Dike, PA recommends that you complete a bowel purge (to clean out your bowels). Please do the following:  Sutab bowel prep 1. Open one bottle of 12 tablets. 2.   Fill the provided container with 16 ounces of water (up to the fill line). Swallow each tablet with a sip of water and drink the entire amount over 15 to 20 minutes. 3.   Approximately one hour after the last tablet is ingested, fill the provided container a second time with 16 ounces of water (up to the fill line) and drink the entire amount over 30 minutes. 4.   Approximately 30 minutes after finishing the second container of water, fill the provided container again with 16 ounces of water (up to the fill line) and drink the entire amount over 30 minutes.  Call office to schedule follow up appointment on March 1st.
